# Patient Record
Sex: Male | Born: 1980 | Hispanic: No | Marital: Married | Smoking: Former smoker
Health system: Southern US, Community
[De-identification: ages and names within clinical notes are randomized; demographics above are authoritative.]

## PROBLEM LIST (undated history)

## (undated) ENCOUNTER — Emergency Department (HOSPITAL_COMMUNITY): Admission: EM | Source: Home / Self Care

## (undated) DIAGNOSIS — F419 Anxiety disorder, unspecified: Secondary | ICD-10-CM

## (undated) DIAGNOSIS — N200 Calculus of kidney: Secondary | ICD-10-CM

## (undated) DIAGNOSIS — K602 Anal fissure, unspecified: Secondary | ICD-10-CM

## (undated) DIAGNOSIS — K649 Unspecified hemorrhoids: Secondary | ICD-10-CM

## (undated) DIAGNOSIS — Z87442 Personal history of urinary calculi: Secondary | ICD-10-CM

## (undated) HISTORY — PX: NO PAST SURGERIES: SHX2092

## (undated) HISTORY — DX: Unspecified hemorrhoids: K64.9

## (undated) HISTORY — PX: POLYPECTOMY: SHX149

## (undated) HISTORY — DX: Anxiety disorder, unspecified: F41.9

## (undated) HISTORY — DX: Anal fissure, unspecified: K60.2

---
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
## 2014-10-09 ENCOUNTER — Emergency Department (HOSPITAL_COMMUNITY)
Admission: EM | Admit: 2014-10-09 | Discharge: 2014-10-09 | Disposition: A | Discharge status: Home / Self Care | Payer: No Typology Code available for payment source | Attending: Emergency Medicine | Admitting: Emergency Medicine

## 2014-10-09 ENCOUNTER — Encounter (HOSPITAL_COMMUNITY): Payer: Self-pay

## 2014-10-09 DIAGNOSIS — K2970 Gastritis, unspecified, without bleeding: Secondary | ICD-10-CM | POA: Insufficient documentation

## 2014-10-09 DIAGNOSIS — K219 Gastro-esophageal reflux disease without esophagitis: Secondary | ICD-10-CM | POA: Diagnosis not present

## 2014-10-09 DIAGNOSIS — R1013 Epigastric pain: Secondary | ICD-10-CM | POA: Diagnosis present

## 2014-10-09 LAB — CBC WITH DIFFERENTIAL/PLATELET
BASOS ABS: 0.1 10*3/uL (ref 0.0–0.1)
Basophils Relative: 1 % (ref 0–1)
EOS PCT: 3 % (ref 0–5)
Eosinophils Absolute: 0.2 10*3/uL (ref 0.0–0.7)
HEMATOCRIT: 38.9 % — AB (ref 39.0–52.0)
HEMOGLOBIN: 13.3 g/dL (ref 13.0–17.0)
Lymphocytes Relative: 39 % (ref 12–46)
Lymphs Abs: 2.9 10*3/uL (ref 0.7–4.0)
MCH: 30.6 pg (ref 26.0–34.0)
MCHC: 34.2 g/dL (ref 30.0–36.0)
MCV: 89.4 fL (ref 78.0–100.0)
MONOS PCT: 9 % (ref 3–12)
Monocytes Absolute: 0.6 10*3/uL (ref 0.1–1.0)
NEUTROS ABS: 3.6 10*3/uL (ref 1.7–7.7)
Neutrophils Relative %: 48 % (ref 43–77)
Platelets: 332 10*3/uL (ref 150–400)
RBC: 4.35 MIL/uL (ref 4.22–5.81)
RDW: 12.8 % (ref 11.5–15.5)
WBC: 7.4 10*3/uL (ref 4.0–10.5)

## 2014-10-09 LAB — COMPREHENSIVE METABOLIC PANEL
ALBUMIN: 4.1 g/dL (ref 3.5–5.0)
ALK PHOS: 49 U/L (ref 38–126)
ALT: 28 U/L (ref 17–63)
AST: 25 U/L (ref 15–41)
Anion gap: 9 (ref 5–15)
BILIRUBIN TOTAL: 0.5 mg/dL (ref 0.3–1.2)
BUN: 19 mg/dL (ref 6–20)
CO2: 25 mmol/L (ref 22–32)
CREATININE: 1.09 mg/dL (ref 0.61–1.24)
Calcium: 9.3 mg/dL (ref 8.9–10.3)
Chloride: 105 mmol/L (ref 101–111)
GFR calc Af Amer: >60 mL/min (ref >60)
GFR calc non Af Amer: >60 mL/min (ref >60)
Glucose, Bld: 94 mg/dL (ref 65–99)
Potassium: 3.6 mmol/L (ref 3.5–5.1)
Sodium: 139 mmol/L (ref 135–145)
Total Protein: 7.3 g/dL (ref 6.5–8.1)

## 2014-10-09 LAB — LIPASE, BLOOD: Lipase: 38 U/L (ref 22–51)

## 2014-10-09 MED ORDER — PANTOPRAZOLE SODIUM 40 MG PO TBEC: 40.0000 mg | DELAYED_RELEASE_TABLET | Freq: Every day | ORAL | Status: DC

## 2014-10-09 MED ORDER — ONDANSETRON 4 MG PO TBDP
4.0000 mg | ORAL_TABLET | Freq: Once | ORAL | Status: AC
  Administered 2014-10-09: 4 mg via ORAL
  Filled 2014-10-09: qty 1

## 2014-10-09 MED ORDER — GI COCKTAIL ~~LOC~~
30.0000 mL | Freq: Once | ORAL | Status: AC
  Administered 2014-10-09: 30 mL via ORAL
  Filled 2014-10-09: qty 30

## 2014-10-09 MED ORDER — ONDANSETRON 4 MG PO TBDP: 4.0000 mg | ORAL_TABLET | Freq: Three times a day (TID) | ORAL | Status: DC | PRN

## 2014-10-09 MED ORDER — SUCRALFATE 1 G PO TABS: 1.0000 g | ORAL_TABLET | Freq: Three times a day (TID) | ORAL | Status: DC

## 2014-10-09 MED ORDER — PANTOPRAZOLE SODIUM 40 MG PO TBEC
40.0000 mg | DELAYED_RELEASE_TABLET | Freq: Once | ORAL | Status: AC
  Administered 2014-10-09: 40 mg via ORAL
  Filled 2014-10-09: qty 1

## 2014-10-09 NOTE — ED Notes (Signed)
Discharge instructions reviewed, prescriptions reviewed voiced understanding

## 2014-10-09 NOTE — ED Notes (Signed)
Pt arrived via POV c/o mid upper abdominal pain with nausea and headaches for the past 10days.

## 2014-10-09 NOTE — ED Provider Notes (Signed)
TIME SEEN: 4:30 AM  CHIEF COMPLAINT: Abdominal pain, headache, nausea  HPI: Pt is a 34 y.o. male with no synovial past medical history who presents to the emergency room at 10 days of intermittent epigastric pain with burning pain that radiates up into his chest is worse after eating intermittently. Reports pain is worse with eating. No fever. He has had nausea. No vomiting or diarrhea. No bloody stools or melena. Reports he is also had intermittent mild headaches for the past several weeks which she is taking NSAIDs for. No alcohol use. No numbness, tingling or focal weakness. No head injury. Denies headache currently.  ROS: See HPI Constitutional: no fever  Eyes: no drainage  ENT: no runny nose   Cardiovascular:  no chest pain  Resp: no SOB  GI: no vomiting GU: no dysuria Integumentary: no rash  Allergy: no hives  Musculoskeletal: no leg swelling  Neurological: no slurred speech ROS otherwise negative  PAST MEDICAL HISTORY/PAST SURGICAL HISTORY:  History reviewed. No pertinent past medical history.  MEDICATIONS:  Prior to Admission medications   Not on File    ALLERGIES:  No Known Allergies  SOCIAL HISTORY:  History  Substance Use Topics  . Smoking status: Never Smoker   . Smokeless tobacco: Never Used  . Alcohol Use: No    FAMILY HISTORY: History reviewed. No pertinent family history.  EXAM: BP 143/94 mmHg  Pulse 64  Temp(Src) 98 F (36.7 C) (Oral)  Resp 16  Ht 5\' 9"  (1.753 m)  Wt 199 lb (90.266 kg)  BMI 29.37 kg/m2  SpO2 99% CONSTITUTIONAL: Alert and oriented and responds appropriately to questions. Well-appearing; well-nourished, nontoxic, afebrile HEAD: Normocephalic EYES: Conjunctivae clear, PERRL ENT: normal nose; no rhinorrhea; moist mucous membranes; pharynx without lesions noted NECK: Supple, no meningismus, no LAD  CARD: RRR; S1 and S2 appreciated; no murmurs, no clicks, no rubs, no gallops RESP: Normal chest excursion without splinting or  tachypnea; breath sounds clear and equal bilaterally; no wheezes, no rhonchi, no rales, no hypoxia or respiratory distress, speaking full sentences ABD/GI: Normal bowel sounds; non-distended; soft, non-tender, no rebound, no guarding, no peritoneal signs, negative Murphy sign, no tenderness at McBurney's point BACK:  The back appears normal and is non-tender to palpation, there is no CVA tenderness EXT: Normal ROM in all joints; non-tender to palpation; no edema; normal capillary refill; no cyanosis, no calf tenderness or swelling    SKIN: Normal color for age and race; warm NEURO: Moves all extremities equally, sensation to light touch intact diffusely, cranial nerves II through XII intact, normal gait PSYCH: The patient's mood and manner are appropriate. Grooming and personal hygiene are appropriate.  MEDICAL DECISION MAKING: Patient here with epigastric abdominal pain. Suspect gastritis, GERD. Abdominal exam is completely benign. Afebrile, hemodynamically stable. Labs unremarkable. Feels better after GI cocktail, Protonix and Zofran. We'll discharge with prescriptions for Protonix and Carafate. Have advised him to avoid NSAIDs. Discussed with patient that he may use Tylenol as needed for his headaches at home. Have recommended diet changes. I do not feel he needs further emergent workup at this time. I feel he is safe to be discharged home. Provided him with outpatient follow-up information. Discussed usual and customary return precautions. He verbalizes understanding and is comfortable with this plan.       [place], DO 10/09/14 [phone]

## 2014-10-09 NOTE — Discharge Instructions (Signed)
Please do not drink alcohol or take anti-inflammatory such as ibuprofen, aspirin, Aleve, Goody powders. You may take Tylenol 1000 mg every 6 hours as needed for pain. This medication is found over-the-counter and is safe to take for your headaches.   Food Choices for Gastroesophageal Reflux Disease When you have gastroesophageal reflux disease (GERD), the foods you eat and your eating habits are very important. Choosing the right foods can help ease the discomfort of GERD. WHAT GENERAL GUIDELINES DO I NEED TO FOLLOW?  Choose fruits, vegetables, whole grains, low-fat dairy products, and low-fat meat, fish, and poultry.  Limit fats such as oils, salad dressings, butter, nuts, and avocado.  Keep a food diary to identify foods that cause symptoms.  Avoid foods that cause reflux. These may be different for different people.  Eat frequent small meals instead of three large meals each day.  Eat your meals slowly, in a relaxed setting.  Limit fried foods.  Cook foods using methods other than frying.  Avoid drinking alcohol.  Avoid drinking large amounts of liquids with your meals.  Avoid bending over or lying down until 2-3 hours after eating. WHAT FOODS ARE NOT RECOMMENDED? The following are some foods and drinks that may worsen your symptoms: Vegetables Tomatoes. Tomato juice. Tomato and spaghetti sauce. Chili peppers. Onion and garlic. Horseradish. Fruits Oranges, grapefruit, and lemon (fruit and juice). Meats High-fat meats, fish, and poultry. This includes hot dogs, ribs, ham, sausage, salami, and bacon. Dairy Whole milk and chocolate milk. Sour cream. Cream. Butter. Ice cream. Cream cheese.  Beverages Coffee and tea, with or without caffeine. Carbonated beverages or energy drinks. Condiments Hot sauce. Barbecue sauce.  Sweets/Desserts Chocolate and cocoa. Donuts. Peppermint and spearmint. Fats and Oils High-fat foods, including [place] fries and potato  chips. Other Vinegar. Strong spices, such as black pepper, white pepper, red pepper, cayenne, curry powder, cloves, ginger, and chili powder. The items listed above may not be a complete list of foods and beverages to avoid. Contact your dietitian for more information. Document Released: 02/18/2005 Document Revised: 02/23/2013 Document Reviewed: 12/23/2012 [facility] Patient Information 2015 [place], [place]. This information is not intended to replace advice given to you by your health care provider. Make sure you discuss any questions you have with your health care provider.  Gastritis, Adult Gastritis is soreness and swelling (inflammation) of the lining of the stomach. Gastritis can develop as a sudden onset (acute) or long-term (chronic) condition. If gastritis is not treated, it can lead to stomach bleeding and ulcers. CAUSES  Gastritis occurs when the stomach lining is weak or damaged. Digestive juices from the stomach then inflame the weakened stomach lining. The stomach lining may be weak or damaged due to viral or bacterial infections. One common bacterial infection is the Helicobacter pylori infection. Gastritis can also result from excessive alcohol consumption, taking certain medicines, or having too much acid in the stomach.  SYMPTOMS  In some cases, there are no symptoms. When symptoms are present, they may include:  Pain or a burning sensation in the upper abdomen.  Nausea.  Vomiting.  An uncomfortable feeling of fullness after eating. DIAGNOSIS  Your caregiver may suspect you have gastritis based on your symptoms and a physical exam. To determine the cause of your gastritis, your caregiver may perform the following:  Blood or stool tests to check for the H pylori bacterium.  Gastroscopy. A thin, flexible tube (endoscope) is passed down the esophagus and into the stomach. The endoscope has a light and camera  on the end. Your caregiver uses the endoscope to view the inside of  the stomach.  Taking a tissue sample (biopsy) from the stomach to examine under a microscope. TREATMENT  Depending on the cause of your gastritis, medicines may be prescribed. If you have a bacterial infection, such as an H pylori infection, antibiotics may be given. If your gastritis is caused by too much acid in the stomach, H2 blockers or antacids may be given. Your caregiver may recommend that you stop taking aspirin, ibuprofen, or other nonsteroidal anti-inflammatory drugs (NSAIDs). HOME CARE INSTRUCTIONS  Only take over-the-counter or prescription medicines as directed by your caregiver.  If you were given antibiotic medicines, take them as directed. Finish them even if you start to feel better.  Drink enough fluids to keep your urine clear or pale yellow.  Avoid foods and drinks that make your symptoms worse, such as:  Caffeine or alcoholic drinks.  Chocolate.  Peppermint or mint flavorings.  Garlic and onions.  Spicy foods.  Citrus fruits, such as oranges, lemons, or limes.  Tomato-based foods such as sauce, chili, salsa, and pizza.  Fried and fatty foods.  Eat small, frequent meals instead of large meals. SEEK IMMEDIATE MEDICAL CARE IF:   You have black or dark red stools.  You vomit blood or material that looks like coffee grounds.  You are unable to keep fluids down.  Your abdominal pain gets worse.  You have a fever.  You do not feel better after 1 week.  You have any other questions or concerns. MAKE SURE YOU:  Understand these instructions.  Will watch your condition.  Will get help right away if you are not doing well or get worse. Document Released: 02/12/2001 Document Revised: 08/20/2011 Document Reviewed: 04/03/2011 [facility] Patient Information 2015 [place], [place]. This information is not intended to replace advice given to you by your health care provider. Make sure you discuss any questions you have with your health care  provider.  Gastroesophageal Reflux Disease, Adult Gastroesophageal reflux disease (GERD) happens when acid from your stomach flows up into the esophagus. When acid comes in contact with the esophagus, the acid causes soreness (inflammation) in the esophagus. Over time, GERD may create small holes (ulcers) in the lining of the esophagus. CAUSES   Increased body weight. This puts pressure on the stomach, making acid rise from the stomach into the esophagus.  Smoking. This increases acid production in the stomach.  Drinking alcohol. This causes decreased pressure in the lower esophageal sphincter (valve or ring of muscle between the esophagus and stomach), allowing acid from the stomach into the esophagus.  Late evening meals and a full stomach. This increases pressure and acid production in the stomach.  A malformed lower esophageal sphincter. Sometimes, no cause is found. SYMPTOMS   Burning pain in the lower part of the mid-chest behind the breastbone and in the mid-stomach area. This may occur twice a week or more often.  Trouble swallowing.  Sore throat.  Dry cough.  Asthma-like symptoms including chest tightness, shortness of breath, or wheezing. DIAGNOSIS  Your caregiver may be able to diagnose GERD based on your symptoms. In some cases, X-rays and other tests may be done to check for complications or to check the condition of your stomach and esophagus. TREATMENT  Your caregiver may recommend over-the-counter or prescription medicines to help decrease acid production. Ask your caregiver before starting or adding any new medicines.  HOME CARE INSTRUCTIONS   Change the factors that you can  control. Ask your caregiver for guidance concerning weight loss, quitting smoking, and alcohol consumption.  Avoid foods and drinks that make your symptoms worse, such as:  Caffeine or alcoholic drinks.  Chocolate.  Peppermint or mint flavorings.  Garlic and onions.  Spicy  foods.  Citrus fruits, such as oranges, lemons, or limes.  Tomato-based foods such as sauce, chili, salsa, and pizza.  Fried and fatty foods.  Avoid lying down for the 3 hours prior to your bedtime or prior to taking a nap.  Eat small, frequent meals instead of large meals.  Wear loose-fitting clothing. Do not wear anything tight around your waist that causes pressure on your stomach.  Raise the head of your bed 6 to 8 inches with wood blocks to help you sleep. Extra pillows will not help.  Only take over-the-counter or prescription medicines for pain, discomfort, or fever as directed by your caregiver.  Do not take aspirin, ibuprofen, or other nonsteroidal anti-inflammatory drugs (NSAIDs). SEEK IMMEDIATE MEDICAL CARE IF:   You have pain in your arms, neck, jaw, teeth, or back.  Your pain increases or changes in intensity or duration.  You develop nausea, vomiting, or sweating (diaphoresis).  You develop shortness of breath, or you faint.  Your vomit is green, yellow, black, or looks like coffee grounds or blood.  Your stool is red, bloody, or black. These symptoms could be signs of other problems, such as heart disease, gastric bleeding, or esophageal bleeding. MAKE SURE YOU:   Understand these instructions.  Will watch your condition.  Will get help right away if you are not doing well or get worse. Document Released: 11/28/2004 Document Revised: 05/13/2011 Document Reviewed: 09/07/2010 [facility] Patient Information 2015 [place], [place]. This information is not intended to replace advice given to you by your health care provider. Make sure you discuss any questions you have with your health care provider.    Emergency Department Resource Guide 1) Find a Doctor and Pay Out of Pocket Although you won't have to find out who is covered by your insurance plan, it is a good idea to ask around and get recommendations. You will then need to call the office and see if the  doctor you have chosen will accept you as a new patient and what types of options they offer for patients who are self-pay. Some doctors offer discounts or will set up payment plans for their patients who do not have insurance, but you will need to ask so you aren't surprised when you get to your appointment.  2) Contact Your Local Health Department Not all health departments have doctors that can see patients for sick visits, but many do, so it is worth a call to see if yours does. If you don't know where your local health department is, you can check in your phone book. The CDC also has a tool to help you locate your state's health department, and many state websites also have listings of all of their local health departments.  3) Find a [facility] If your illness is not likely to be very severe or complicated, you may want to try a walk in clinic. These are popping up all over the country in pharmacies, drugstores, and shopping centers. They're usually staffed by nurse practitioners or physician assistants that have been trained to treat common illnesses and complaints. They're usually fairly quick and inexpensive. However, if you have serious medical issues or chronic medical problems, these are probably not your best option.  No Primary  Care Doctor: - Call Health Connect at  [phone] - they can help you locate a primary care doctor that  accepts your insurance, provides certain services, etc. - Physician Referral Service- [phone]  Chronic Pain Problems: Organization         Address  Phone   Notes  [facility]  [phone] Patients need to be referred by their primary care doctor.   Medication Assistance: Organization         Address  Phone   Notes  [facility] Medication [facility] [address] [place], [place] [address] [phone] --Must be a resident of [facility] -- Must have NO insurance coverage whatsoever (no Medicaid/  Medicare, etc.) -- The pt. MUST have a primary care doctor that directs their care regularly and follows them in the community   [facility]ist  [phone]   [employer]  [phone]    Agencies that provide inexpensive medical care: Organization         Address  Phone   Notes  [place]  [phone]   [facility]    [phone]   [facility] [place], [place] [address] [phone]   [place] [address] [place] [phone]   Planned Parenthood    [phone]   [facility]    [phone]   [place] [address], [place] Phone:  [phone], Fax:  [phone] Hours of Operation:  9 am - 6 pm, M-F.  Also accepts Medicaid/Medicare and self-pay.  [facility] for [place], [address], [place] Phone: [phone], Fax: [phone]. Hours of Operation:  8:30 am - 5:30 pm, M-F.  Also accepts Medicaid and self-pay.  [facility] [address] [place] Phone: [phone]   [place], [place], [place] [phone], Ext. [phone] Mondays & Thursdays: 7-9 AM.  First 15 patients are seen on a first come, first serve basis.    Hope Providers:  Organization         Address  Phone   Notes  [facility] [address], [place] [phone] Also accepts self-pay patients.  [facility] [address], [place]  [phone]   [place], [address], [place] [phone]   [facility] [address] [place] [phone]   Lucianne Lei [address], [place]   [phone] Only accepts [place] Access [place] patients after they have their name applied to their card.    Self-Pay (no insurance) in [facility]:  Organization         Address  Phone   Notes  Sickle Cell Patients, [facility] [place] [phone]   [facility] [place] [phone]   [facility] [place]  [place], [place], [place] [phone]   [facility]/Dr. Osei-Bonsu  [address], [place] or [address], [place] [phone] Phone number for both [place] and [place] locations is the same.  Urgent Medical and [facility] [address] [place] [phone]  [facility] [address], [place] or [address] [phone]   [facility] [place], [place] [phone], phone; [phone], fax Sees patients 1st and 3rd Saturday of every month.  Must not qualify for public or private insurance (i.e. Medicaid, Medicare, [place] Health Choice, Veterans' Benefits)  Household income should be no more than 200% of the poverty level The clinic cannot treat you if you are pregnant or think you are pregnant  Sexually transmitted diseases are not treated at the clinic.    Dental Care: Organization         Address  Phone  Notes  [facility] Department of [facility] [place] [phone] Accepts children up to age 20 who are enrolled in [place] or [place]; pregnant women with a Medicaid card; and children who have applied for Medicaid or [place] Health Choice, but were declined, whose parents can pay a reduced fee at time of service.  [facility] Department of [facility]  [address], [place] [phone] Accepts children up to age 106 who are enrolled in [place] or [place]; pregnant women with a Medicaid card; and children who have applied for Medicaid or [place] Health Choice,  but were declined, whose parents can pay a reduced fee at time of service.  [facility] Adult [facility] Access PRO[facility]  [place] [phone] Patients are seen by appointment only. Walk-ins are not accepted. [place] will see patients 51 years of age and older. Monday - Tuesday (8am-5pm) Most Wednesdays (8:30-5pm) $30 per visit, cash only  [facility] Adult Dental Access PROGRAM  [address], [facility] [phone] Patients are seen by appointment only. Walk-ins are not accepted. [place] will see patients 48 years of age and older. One Wednesday Evening (Monthly: Volunteer Based).  $30 per visit, cash only  [place]  [phone] for adults; Children under age 50, call Graduate Pediatric Dentistry at [phone]. Children aged 56-14, please call [phone] to request a pediatric application.  Dental services are provided in all areas of dental care including fillings, crowns and bridges, complete and partial dentures, implants, gum treatment, root canals, and extractions. Preventive care is also provided. Treatment is provided to both adults and children. Patients are selected via a lottery and there is often a waiting list.   [facility] [address], [place]  [phone] [email or web address]   [facility] [address] [place], [place] [phone], Ext. [phone] Second and Fourth Thursday of each month, opens at 6:30 AM; Clinic ends at 9 AM.  Patients are seen on a first-come first-served basis, and a limited number are seen during each clinic.   [facility]  [address] [place], [place] [phone]   Eligibility Requirements You must have lived in [place], [place], or [place] counties for at least the last three months.   You cannot be eligible for state or federal sponsored [employer], including [employer], [place], or [employer].   You generally  cannot be eligible for healthcare insurance through your employer.    How to apply: Eligibility screenings are held every Tuesday and Wednesday afternoon from 1:00 pm until 4:00 pm. You do not need an appointment for the interview!  [facility] [address], [place], [place]  [place]  [place] Department  [place]  [phone]    Behavioral Health Resources in the Community: Intensive Outpatient Programs Organization         Address  Phone  Notes  [place]. [address] [place], [place] [phone]   [facility] [address] [place], [place]   ADS: Alcohol & Drug Svcs [address], [place], [place]   [place] [address],  [place], [place] or [phone]   Substance Abuse Resources Organization         Address  Phone  Notes  Alcohol and Drug Services  [phone]   [place]  [phone]   [facility] [place]   Chinita Pester  [phone]   Residential & Outpatient Substance Abuse Program  [phone]   Psychological Services Organization         Address  Phone  Notes  [facility] [place]  [place]  [phone]   [place] [address] [place] or [phone]    Mobile Crisis Teams Organization         Address  Phone  Notes  [facility], Mobile Crisis Care Unit  [phone]   [facility]  [address] [place], [place]   [place] [address], [place] [phone]    Self-Help/Support Groups Organization         Address  Phone             Notes  [place]. of [place] - variety of support groups  [place] Call for more  information  Narcotics Anonymous (NA), [facility] [address], [employer] [place]  2 meetings at this location   Special educational needs teacher         Address  Phone  Notes  [facility]idential [facility] [place],    [place]  [phone]   [facility]  [address] [place] [id number], [place], [place]   [place], [place] [phone] Admissions: 8am-3pm M-F  Incentives Substance [place] [address],    [place], [place] [phone]   [facility] [address] [place], [place], [place]   [facility] [address]  [place], [place]   [facility] - Intensive Outpatient [address], [place], [place]   [facility] [place].,  [place], [place] [phone] or [phone]   [facility] Services (RTS) [address] [place], [place] Accepts Medicaid  [facility] [place] [address],  [place] [phone] Substance Abuse/Addiction Treatment   [facility] Organization         Address  Phone  Notes  [facility]  [phone]   Domenic Schwab, PhD [address] [place], [place]   5876825902 or [phone]   [place], [place] [phone]   [place] [address] [place], [place] [phone] Insurance/Medicaid/sponsorship through [employer] and Families [address], [id number]  [place], [place] [phone] [place], [place] [phone]    Dr. Adele Schilder  [phone]   [facility] Dept. 1) [address] [place] 2) [place] 3)  [place] [address], [place] [phone]  [phone]   [place] [phone] or [phone] (After Hours)

## 2015-09-06 ENCOUNTER — Encounter: Payer: Self-pay | Admitting: Surgery

## 2016-08-31 ENCOUNTER — Encounter (HOSPITAL_COMMUNITY): Payer: Self-pay | Admitting: Emergency Medicine

## 2016-08-31 ENCOUNTER — Emergency Department (HOSPITAL_COMMUNITY)
Admission: EM | Admit: 2016-08-31 | Discharge: 2016-08-31 | Disposition: A | Discharge status: Home / Self Care | Payer: PRIVATE HEALTH INSURANCE | Attending: Emergency Medicine | Admitting: Emergency Medicine

## 2016-08-31 DIAGNOSIS — M545 Low back pain: Secondary | ICD-10-CM | POA: Diagnosis present

## 2016-08-31 DIAGNOSIS — Z79899 Other long term (current) drug therapy: Secondary | ICD-10-CM | POA: Diagnosis not present

## 2016-08-31 DIAGNOSIS — G8929 Other chronic pain: Secondary | ICD-10-CM | POA: Diagnosis not present

## 2016-08-31 DIAGNOSIS — M5442 Lumbago with sciatica, left side: Secondary | ICD-10-CM | POA: Diagnosis not present

## 2016-08-31 MED ORDER — OXYCODONE HCL 5 MG PO TABS: 5.0000 mg | ORAL_TABLET | Freq: Four times a day (QID) | ORAL | 0 refills | Status: AC | PRN

## 2016-08-31 MED ORDER — LIDOCAINE 5 % EX PTCH
1.0000 | MEDICATED_PATCH | CUTANEOUS | Status: DC
  Administered 2016-08-31: 1 via TRANSDERMAL
  Filled 2016-08-31: qty 1

## 2016-08-31 MED ORDER — ACETAMINOPHEN 500 MG PO TABS
1000.0000 mg | ORAL_TABLET | Freq: Once | ORAL | Status: AC
  Administered 2016-08-31: 1000 mg via ORAL
  Filled 2016-08-31: qty 2

## 2016-08-31 MED ORDER — PREDNISONE 10 MG (21) PO TBPK: ORAL_TABLET | Freq: Every day | ORAL | 0 refills | Status: DC

## 2016-08-31 MED ORDER — KETOROLAC TROMETHAMINE 30 MG/ML IJ SOLN
30.0000 mg | Freq: Once | INTRAMUSCULAR | Status: AC
  Administered 2016-08-31: 30 mg via INTRAMUSCULAR
  Filled 2016-08-31: qty 1

## 2016-08-31 MED ORDER — LIDOCAINE 5 % EX PTCH: 1.0000 | MEDICATED_PATCH | CUTANEOUS | 0 refills | Status: AC

## 2016-08-31 MED ORDER — OXYCODONE HCL 5 MG PO TABS
5.0000 mg | ORAL_TABLET | Freq: Once | ORAL | Status: AC
  Administered 2016-08-31: 5 mg via ORAL
  Filled 2016-08-31: qty 1

## 2016-08-31 MED ORDER — PREDNISONE 20 MG PO TABS
60.0000 mg | ORAL_TABLET | Freq: Once | ORAL | Status: AC
  Administered 2016-08-31: 60 mg via ORAL
  Filled 2016-08-31: qty 3

## 2016-08-31 NOTE — ED Provider Notes (Signed)
[facility] DEPT Provider Note   CSN: [id number] Arrival date & time: 08/31/16  1623     History   Chief Complaint Chief Complaint  Patient presents with  . Back Pain    HPI Jeremy Mendoza is a 36 y.o. male with history of GERD presents to the ED with persistent lower back pain, bilateral, 2 months. Pain is intermittent, radiating to lateral left lower leg. Associated symptoms include intermittent numbness to the entire left foot and intermittent left-sided scrotal numbness. Denies fevers, abdominal pain, IV drug use, bladder/bowel incontinence or retention. No preceding falls, and MVCs, exertional activities or previous injuries. Patient was evaluated at urgent care and was prescribed prednisone, Flexeril, diclofenac. Patient reports mild improvement with prednisone.  HPI  History reviewed. No pertinent past medical history.  There are no active problems to display for this patient.   History reviewed. No pertinent surgical history.     Home Medications    Prior to Admission medications   Medication Sig Start Date End Date Taking? Authorizing Provider  lidocaine (LIDODERM) 5 % Place 1 patch onto the skin daily. Apply to low back. Change every 24 hours. 08/31/16 09/05/16  Kinnie Feil, PA-C  ondansetron (ZOFRAN ODT) 4 MG disintegrating tablet Take 1 tablet (4 mg total) by mouth every 8 (eight) hours as needed for nausea or vomiting. 10/09/14   Ward, Delice Bison, DO  oxyCODONE (ROXICODONE) 5 MG immediate release tablet Take 1 tablet (5 mg total) by mouth every 6 (six) hours as needed for severe pain. 08/31/16 09/03/16  Kinnie Feil, PA-C  pantoprazole (PROTONIX) 40 MG tablet Take 1 tablet (40 mg total) by mouth daily. 10/09/14   Ward, Delice Bison, DO  predniSONE (STERAPRED UNI-PAK 21 TAB) 10 MG (21) TBPK tablet Take by mouth daily. Take 6 tabs by mouth daily  for 2 days, then 5 tabs for 2 days, then 4 tabs for 2 days, then 3 tabs for 2 days, 2 tabs for 2 days, then 1 tab by  mouth daily for 2 days 08/31/16   Kinnie Feil, PA-C  sucralfate (CARAFATE) 1 G tablet Take 1 tablet (1 g total) by mouth 4 (four) times daily -  with meals and at bedtime. 10/09/14   Ward, Delice Bison, DO    Family History No family history on file.  Social History Social History  Substance Use Topics  . Smoking status: Never Smoker  . Smokeless tobacco: Never Used  . Alcohol use No     Allergies   Patient has no known allergies.   Review of Systems Review of Systems  Constitutional: Negative for fever and unexpected weight change.  Gastrointestinal: Negative for abdominal pain, constipation, diarrhea, nausea and vomiting.  Genitourinary: Negative for difficulty urinating.       +scrotal numbness   Musculoskeletal: Positive for back pain and myalgias. Negative for neck pain and neck stiffness.  Skin: Negative for color change and rash.  Allergic/Immunologic: Negative for immunocompromised state.  Neurological: Positive for numbness. Negative for weakness and headaches.     Physical Exam Updated Vital Signs BP 135/79 (BP Location: Left Arm)   Pulse 71   Temp 98.1 F (36.7 C) (Oral)   Resp 16   Ht 5\' 9"  (1.753 m)   Wt 89.8 kg (198 lb)   SpO2 100%   BMI 29.24 kg/m   Physical Exam  Constitutional: He is oriented to person, place, and time. He appears well-developed and well-nourished. No distress.  NAD.  HENT:  Head: Normocephalic  and atraumatic.  Right Ear: External ear normal.  Left Ear: External ear normal.  Nose: Nose normal.  Eyes: Conjunctivae and EOM are normal. No scleral icterus.  Neck: Normal range of motion. Neck supple.  No midline cervical spine tenderness  Cardiovascular: Normal rate, regular rhythm, normal heart sounds and intact distal pulses.   No murmur heard. Pulmonary/Chest: Effort normal and breath sounds normal. He has no wheezes.  Abdominal: Soft. There is no tenderness.  Genitourinary:  Genitourinary Comments: Sensation to light  touch (q tip) intact over scrotum Cremasteric reflex intact bilaterally  Musculoskeletal: Normal range of motion. He exhibits tenderness. He exhibits no deformity.  Antalgic gait favoring L side.  Pain with lumbar flexion/extension otherwise full active ROM of CTL spine including flexion, extension, lateral bend and rotation Midline lumbar spine tenderness, bilateral SI joints, bilateral sciatic notches (L>R).  No CTL paraspinal tenderness or increased tone.  Full passive hip, knee and ankle ROM bilaterally.  Positive SLR, bilaterally.  Positive Faber, bilaterally.  Negative Stinchfield test.   Neurological: He is alert and oriented to person, place, and time.  5/5 strength with hip flexion and extension, bilaterally.  5/5 strength with knee flexion and extension, bilaterally.  5/5 strength with ankle dorsiflexion and plantar flexion, bilaterally.  Sensation to light touch intact in the distribution of the obturator nerve and lateral cutaneous nerve  Feet: sensation to light touch intact in the distribution of the saphenous nerve and sural nerve, bilaterally.  2/4 knee DTR bilaterally.    Skin: Skin is warm and dry. Capillary refill takes less than 2 seconds.  Psychiatric: He has a normal mood and affect. His behavior is normal. Judgment and thought content normal.  Nursing note and vitals reviewed.    ED Treatments / Results  Labs (all labs ordered are listed, but only abnormal results are displayed) Labs Reviewed - No data to display  EKG  EKG Interpretation None       Radiology No results found.  Procedures Procedures (including critical care time)  Medications Ordered in ED Medications  lidocaine (LIDODERM) 5 % 1 patch (not administered)  ketorolac (TORADOL) 30 MG/ML injection 30 mg (30 mg Intramuscular Given 08/31/16 1743)  acetaminophen (TYLENOL) tablet 1,000 mg (1,000 mg Oral Given 08/31/16 1742)  oxyCODONE (Oxy IR/ROXICODONE) immediate release tablet 5 mg (5 mg  Oral Given 08/31/16 1742)  predniSONE (DELTASONE) tablet 60 mg (60 mg Oral Given 08/31/16 1742)     Initial Impression / Assessment and Plan / ED Course  I have reviewed the triage vital signs and the nursing notes.  Pertinent labs & imaging results that were available during my care of the patient were reviewed by me and considered in my medical decision making (see chart for details).    Patient is a 36 y.o. male with no pertinent pmh presents to the ED with bilateral LBP that radiates to left lower leg associated with intermittent numbness to the left foot and left scrotum 2 months. On exam TTP to midline lumbar spine, bilateral SI joints, bilateral sciatic notches. +SLR bilaterally. Normal LE neurological exam. Light touch to scrotum intact today with cremasteric reflex also intact. Initial differential diagnoses include lumbar radiculopathy (most likely), spinal spasms and less likely compression fracture given no trauma. Doubt nephrolithiasis, epidural abscess, dissection, AAA. He does not have fecal incontinence, urinary retention or overflow incontinence, night sweats, waking from sleep with back pain, unexplained fevers or weight loss, h/o cancer, IVDU, recent trauma. However he reports intermittent left scrotal numbness and  left foot numbness so I am considering cauda equina, however neurological exam is normal and scrotal numbness is only intermittent, and symptoms have been going on for >2 months.   Final Clinical Impressions(s) / ED Diagnoses   Final diagnoses:  Chronic bilateral low back pain with left-sided sciatica   I discussed need for MRI. We discussed risk vs benefit of ED MRI to rule out cauda equina.  At this time patient deferred imaging and would like to do this as outpatient due to cost and time. Will discharge with prednisone, high dose NSAIDs, muscle relaxers, lidocaine patch, oxy for break through pain and NSGY follow up. Patient is aware that MRI needs to be done ASAP.   He is aware of symptoms that would warrant immediate return to ED.   New Prescriptions New Prescriptions   LIDOCAINE (LIDODERM) 5 %    Place 1 patch onto the skin daily. Apply to low back. Change every 24 hours.   OXYCODONE (ROXICODONE) 5 MG IMMEDIATE RELEASE TABLET    Take 1 tablet (5 mg total) by mouth every 6 (six) hours as needed for severe pain.   PREDNISONE (STERAPRED UNI-PAK 21 TAB) 10 MG (21) TBPK TABLET    Take by mouth daily. Take 6 tabs by mouth daily  for 2 days, then 5 tabs for 2 days, then 4 tabs for 2 days, then 3 tabs for 2 days, 2 tabs for 2 days, then 1 tab by mouth daily for 2 days     Kinnie Feil, PA-C 08/31/16 1748    Kinnie Feil, PA-C 08/31/16 [place], [place], DO 08/31/16 2055

## 2016-08-31 NOTE — Discharge Instructions (Signed)
As we discussed I suspect your back pain is likely from inflammation of your sciatic nerves.  You have not fallen or injured your back, so it is unlikely you have a fracture.  We discussed MRI today, given your intermittent testicular numbness, however you opted to do this as an outpatient to minimize costs. I think this is reasonable, as your symptoms have been chronic (2 months) and your physical exam today was normal.    You need to get an MRI as soon as possible to rule out cauda equina or severe cord compression.  Establish care with a primary care provider, who may be able to order an MRI as outpatient. Alternatively, you can go directly to a neurosurgeon for further evaluation, a neurosurgeon can also order an MRI.   For symptoms: Prednisone as prescribed 600 mg ibuprofen + 1000 mg every 8 hours for mild/moderate pain 5 mg oxycodone for break through pain or severe pain only  Oxycodone is a narcotic pain medication that has risk of overdose and death, dependence and abuse. Do not drive or use heavy machinery. Do not leave unattended around children.

## 2016-08-31 NOTE — ED Triage Notes (Signed)
Pt c/o low back pain that radiates down B/L legs ongoing for past couple of months. Pt has been seen by PMD for same.

## 2016-09-05 ENCOUNTER — Encounter: Payer: Self-pay | Admitting: Physical Therapy

## 2016-09-05 ENCOUNTER — Ambulatory Visit: Payer: No Typology Code available for payment source | Attending: Family Medicine | Admitting: Physical Therapy

## 2016-09-05 DIAGNOSIS — M5442 Lumbago with sciatica, left side: Secondary | ICD-10-CM | POA: Insufficient documentation

## 2016-09-05 DIAGNOSIS — G8929 Other chronic pain: Secondary | ICD-10-CM | POA: Diagnosis present

## 2016-09-05 DIAGNOSIS — M62838 Other muscle spasm: Secondary | ICD-10-CM | POA: Insufficient documentation

## 2016-09-05 NOTE — Therapy (Addendum)
[facility] [place], [place], [address] Phone: [phone]   Fax:  [phone]  Physical Therapy Evaluation/ Discharge  Patient Details  Name: Jeremy Mendoza MRN: [id number] Date of Birth: 1980/07/11 Referring Provider: Dr Marlane Mingle   Encounter Date: 09/05/2016      PT End of Session - 09/05/16 1341    Visit Number 1   Number of Visits 16   Date for PT Re-Evaluation 10/31/16   Authorization Type [employer]    PT Start Time 1015   PT Stop Time 1101   PT Time Calculation (min) 46 min   Activity Tolerance Patient tolerated treatment well   Behavior During Therapy [facility] for tasks assessed/performed      History reviewed. No pertinent past medical history.  History reviewed. No pertinent surgical history.  There were no vitals filed for this visit.       Subjective Assessment - 09/05/16 1024    Subjective Patient reports about 2 months ago he began having left sided lower back pain and left radicular pain into his anterior thigh. He has had lower backpain before but it has always improved. This pain has not improved. He has increased pain when he sits for too long. 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   Limitations Sitting;Standing;Walking   How long can you sit comfortably? < 10 minutes. Then feels like he can not stand    How long can you stand comfortably? < 10 minutes    How long can you walk comfortably? Limited community distances    Diagnostic tests Nothing at this time    Patient Stated Goals To return to work and to the gym    Currently in Pain? Yes   Pain Score 9    Pain Location Back   Pain Orientation Left   Pain Type Acute pain   Pain Onset More than a month ago   Pain Frequency Constant   Aggravating Factors  Sitting, Standing and walking    Pain Relieving Factors muscle relaxer,    Effect of Pain on Daily Activities Has difficulty standing and  sitting             [facility] PT Assessment - 09/05/16 0001      Assessment   Medical Diagnosis Low back pain    Referring Provider Dr Marlane Mingle    Onset Date/Surgical Date --  2 months    Hand Dominance Right   Next MD Visit None scheduled    Prior Therapy None      Precautions   Precautions None     Restrictions   Weight Bearing Restrictions No     Balance Screen   Has the patient fallen in the past 6 months No   Has the patient had a decrease in activity level because of a fear of falling?  No   Is the patient reluctant to leave their home because of a fear of falling?  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    Leisure [employer]      Cognition   Overall Cognitive Status Within Functional Limits for tasks assessed   Attention Focused   Focused Attention Appears intact   Memory Appears intact   Awareness Appears intact   Problem Solving Appears intact  Sensation   Additional Comments Numbness into the front of his leg      Coordination   Gross Motor Movements are Fluid and Coordinated Yes   Fine Motor Movements are Fluid and Coordinated Yes     ROM / Strength   AROM / PROM / Strength AROM;PROM;Strength     AROM   AROM Assessment Site Lumbar   Lumbar Flexion inclreased pain limited 50%    Lumbar Extension Normal without pain    Lumbar - Right Side Bend ERP    Lumbar - Left Side Bend limited 50% with pain    Lumbar - Right Rotation ERP on the left    Lumbar - Left Rotation limited 25% with pain at end range     PROM   PROM Assessment Site Hip   Right/Left Hip Right;Left   Left Hip Flexion 90 degrees   Left Hip Internal Rotation  --  10 degrees with pain      Strength   Strength Assessment Site Hip;Knee   Right/Left Hip Left   Left Hip Flexion 5/5   Left Hip ABduction 5/5   Left Hip ADduction 5/5     Palpation    Palpation comment spasming into the left glut medius and left lumbar spine      Special Tests    Special Tests --  Straight leg raise (+) at 30 degrees      Ambulation/Gait   Gait Comments decreased left hip flexion with gait             Objective measurements completed on examination: See above findings.          [facility] Adult PT Treatment/Exercise - 09/05/16 0001      Exercises   Exercises --     Lumbar Exercises: Stretches   Single Knee to Chest Stretch Limitations 2x30sec hold bilateral    Piriformis Stretch Limitations 2x30 sec hold      Lumbar Exercises: Standing   Other Standing Lumbar Exercises tennis ball release to glut medius      Lumbar Exercises: Prone   Other Prone Lumbar Exercises prone on elbows 3 min; prone press-ups x10 improvement in prone                 PT Education - 09/05/16 1340    Education provided Yes   Education Details HEP, Continue with stretching and strengthening    Person(s) Educated Patient   Methods Explanation;Demonstration;Tactile cues;Verbal cues   Comprehension Verbalized understanding;Returned demonstration;Verbal cues required          PT Short Term Goals - 09/05/16 1359      PT SHORT TERM GOAL #1   Title Patient will demsotrate a good core contraction with proper breathing    Time 4   Period Weeks   Status New     PT SHORT TERM GOAL #2   Title Patient will increase active left hip flexion to 110 degrees    Time 4   Period Weeks   Status New     PT SHORT TERM GOAL #3   Title Patient will be independent with initial HEP    Time 4   Period Weeks   Status New     PT SHORT TERM GOAL #4   Title Patient will report centralized lower back pain < 3/10    Time 4   Period Weeks   Status New           PT Long Term Goals - 09/05/16 1401  PT LONG TERM GOAL #1   Title Patient will demsotrate full active and passive left hip flexion in order to sit in his car at work    Time 8   Period Weeks    Status New     PT LONG TERM GOAL #2   Title Patient will return to the gym with a program to promote core strength and to decrease stress on the patients back.    Time 8   Period Weeks   Status New     PT LONG TERM GOAL #3   Title Patient will demostrate a 46% limitation on FOTO    Time 8   Period Weeks   Status New                Plan - 09/05/16 1342    Clinical Impression Statement Patient is a 36 year old male with left sided radicular symptoms and ledt sided lower back pain. The pain follows a femoral nerve patrern. He has increased pain with flexionand a positive strength leg test. He has increased pain when sitting. Signs and symotms are consitent with a lumbar disc buldge. He would benefit from further therapy to decrease pain and improve core strength he was seen for a low complexity evaulustion.    Clinical Presentation Evolving   Clinical Presentation due to: radiuclar pain into his left leg that is increasing    Clinical Decision Making Low   Rehab Potential Good   PT Frequency 2x / week   PT Duration 8 weeks   PT Treatment/Interventions ADLs/Self Care Home Management;Cryotherapy;Electrical Stimulation;Iontophoresis '4mg'$ /ml Dexamethasone;Moist Heat;Ultrasound;Parrafin;Gait training;Stair training;Patient/family education;Neuromuscular re-education;Therapeutic exercise;Therapeutic activities;Manual techniques;Passive range of motion;Splinting;Taping   PT Next Visit Plan assess tolerance to prone activity; Continue with soft tissue mobilization of the lumbar spine. Consdier dry needling of glut medius; conadier manual traction/ LAD of the left hip; add abdominal set; clamshell with abdominal set; Consdier qurdurped exercises; review imuse of lumbar roll.    PT Home Exercise Plan prone on elbow, prone press up, piriformis stretch to stretch glut, single knee to chest to improve knee flexion;    Consulted and Agree with Plan of Care Patient      Patient will benefit from  skilled therapeutic intervention in order to improve the following deficits and impairments:  Impaired sensation, Pain, Increased muscle spasms  Visit Diagnosis: Chronic left-sided low back pain with left-sided sciatica - Plan: PT plan of care cert/re-cert  Other muscle spasm - Plan: PT plan of care cert/re-cert   PHYSICAL THERAPY DISCHARGE SUMMARY  Visits from Start of Care: 1  Current functional level related to goals / functional outcomes: Initial Eval only    Remaining deficits: IE only    Education / Equipment: IE only  Plan: Patient agrees to discharge.  Patient goals were not met. Patient is being discharged due to not returning since the last visit.  ?????       Problem List There are no active problems to display for this patient.   Carney Living PT DPT  09/05/2016, 2:13 PM  [facility] [address] [place], [place], [address] Phone: [phone]   Fax:  [phone]  Name: Keagon Glascoe MRN: [id number] Date of Birth: Mar 25, 1980

## 2016-09-25 ENCOUNTER — Encounter: Payer: No Typology Code available for payment source | Admitting: Physical Therapy

## 2016-10-02 ENCOUNTER — Encounter: Payer: No Typology Code available for payment source | Admitting: Physical Therapy

## 2016-10-08 ENCOUNTER — Encounter: Payer: No Typology Code available for payment source | Admitting: Physical Therapy

## 2016-10-15 ENCOUNTER — Encounter: Payer: No Typology Code available for payment source | Admitting: Physical Therapy

## 2016-10-22 ENCOUNTER — Encounter: Payer: No Typology Code available for payment source | Admitting: Physical Therapy

## 2017-05-30 ENCOUNTER — Ambulatory Visit: Payer: Self-pay | Admitting: Internal Medicine

## 2017-09-10 ENCOUNTER — Emergency Department (HOSPITAL_COMMUNITY)
Admission: EM | Admit: 2017-09-10 | Discharge: 2017-09-11 | Disposition: A | Discharge status: Home / Self Care | Payer: No Typology Code available for payment source | Attending: Emergency Medicine | Admitting: Emergency Medicine

## 2017-09-10 ENCOUNTER — Other Ambulatory Visit: Payer: Self-pay

## 2017-09-10 ENCOUNTER — Encounter (HOSPITAL_COMMUNITY): Payer: Self-pay

## 2017-09-10 DIAGNOSIS — R1084 Generalized abdominal pain: Secondary | ICD-10-CM | POA: Insufficient documentation

## 2017-09-10 DIAGNOSIS — N132 Hydronephrosis with renal and ureteral calculous obstruction: Secondary | ICD-10-CM | POA: Diagnosis not present

## 2017-09-10 DIAGNOSIS — N201 Calculus of ureter: Secondary | ICD-10-CM | POA: Diagnosis not present

## 2017-09-10 DIAGNOSIS — N23 Unspecified renal colic: Secondary | ICD-10-CM

## 2017-09-10 LAB — URINALYSIS, ROUTINE W REFLEX MICROSCOPIC
BILIRUBIN URINE: NEGATIVE
Bacteria, UA: NONE SEEN
Glucose, UA: NEGATIVE mg/dL
KETONES UR: NEGATIVE mg/dL
Leukocytes, UA: NEGATIVE
NITRITE: NEGATIVE
PH: 5 (ref 5.0–8.0)
Protein, ur: 30 mg/dL — AB
Specific Gravity, Urine: 1.027 (ref 1.005–1.030)

## 2017-09-10 MED ORDER — ONDANSETRON 4 MG PO TBDP
4.0000 mg | ORAL_TABLET | Freq: Once | ORAL | Status: AC
  Administered 2017-09-10: 4 mg via ORAL
  Filled 2017-09-10: qty 1

## 2017-09-10 MED ORDER — OXYCODONE-ACETAMINOPHEN 5-325 MG PO TABS
1.0000 | ORAL_TABLET | ORAL | Status: DC | PRN
  Administered 2017-09-10: 1 via ORAL
  Filled 2017-09-10: qty 1

## 2017-09-10 NOTE — ED Triage Notes (Signed)
Pt states that for the past several days he has been having L sided flank pain, with dysuria, hesitation, denies hematuria, c/o of nausea.

## 2017-09-11 ENCOUNTER — Emergency Department (HOSPITAL_COMMUNITY): Payer: No Typology Code available for payment source

## 2017-09-11 LAB — BASIC METABOLIC PANEL
Anion gap: 7 (ref 5–15)
BUN: 18 mg/dL (ref 6–20)
CALCIUM: 9.1 mg/dL (ref 8.9–10.3)
CHLORIDE: 105 mmol/L (ref 98–111)
CO2: 26 mmol/L (ref 22–32)
Creatinine, Ser: 1.12 mg/dL (ref 0.61–1.24)
GFR calc non Af Amer: >60 mL/min (ref >60)
Glucose, Bld: 109 mg/dL — ABNORMAL HIGH (ref 70–99)
Potassium: 3.9 mmol/L (ref 3.5–5.1)
SODIUM: 138 mmol/L (ref 135–145)

## 2017-09-11 LAB — CBC WITH DIFFERENTIAL/PLATELET
Abs Immature Granulocytes: 0.1 10*3/uL (ref 0.0–0.1)
Basophils Absolute: 0.1 10*3/uL (ref 0.0–0.1)
Basophils Relative: 1 %
Eosinophils Absolute: 0.2 10*3/uL (ref 0.0–0.7)
Eosinophils Relative: 2 %
HCT: 43.2 % (ref 39.0–52.0)
HEMOGLOBIN: 14.2 g/dL (ref 13.0–17.0)
Immature Granulocytes: 1 %
LYMPHS ABS: 2 10*3/uL (ref 0.7–4.0)
LYMPHS PCT: 21 %
MCH: 30.1 pg (ref 26.0–34.0)
MCHC: 32.9 g/dL (ref 30.0–36.0)
MCV: 91.7 fL (ref 78.0–100.0)
Monocytes Absolute: 0.8 10*3/uL (ref 0.1–1.0)
Monocytes Relative: 9 %
Neutro Abs: 6.4 10*3/uL (ref 1.7–7.7)
Neutrophils Relative %: 66 %
PLATELETS: 308 10*3/uL (ref 150–400)
RBC: 4.71 MIL/uL (ref 4.22–5.81)
RDW: 12.1 % (ref 11.5–15.5)
WBC: 9.5 10*3/uL (ref 4.0–10.5)

## 2017-09-11 MED ORDER — TAMSULOSIN HCL 0.4 MG PO CAPS: 0.4000 mg | ORAL_CAPSULE | Freq: Every day | ORAL | 0 refills | Status: DC

## 2017-09-11 MED ORDER — KETOROLAC TROMETHAMINE 30 MG/ML IJ SOLN
30.0000 mg | Freq: Once | INTRAMUSCULAR | Status: DC
  Filled 2017-09-11: qty 1

## 2017-09-11 MED ORDER — HYDROMORPHONE HCL 1 MG/ML IJ SOLN
1.0000 mg | Freq: Once | INTRAMUSCULAR | Status: AC
  Administered 2017-09-11: 1 mg via INTRAVENOUS
  Filled 2017-09-11: qty 1

## 2017-09-11 MED ORDER — OXYCODONE-ACETAMINOPHEN 5-325 MG PO TABS: 1.0000 | ORAL_TABLET | ORAL | 0 refills | Status: DC | PRN

## 2017-09-11 NOTE — ED Notes (Signed)
Patient verbalizes understanding of discharge instructions. Opportunity for questioning and answers were provided. Armband removed by staff, pt discharged from ED. E signature not available.  

## 2017-09-11 NOTE — ED Notes (Signed)
ED Provider at bedside. 

## 2017-09-11 NOTE — ED Provider Notes (Signed)
[facility] EMERGENCY DEPARTMENT Provider Note   CSN: [id number] Arrival date & time: 09/10/17  2037     History   Chief Complaint Chief Complaint  Patient presents with  . Flank Pain    HPI Jeremy Mendoza is a 37 y.o. male.  Patient presents to the emergency department for evaluation of flank pain.  Patient reports for the last 3 or 4 days he has been having intermittent pain in the left lower back area.  Tonight, however, the pain became severe.  He reports sharp and stabbing pain that had him bent over and unable to walk.  He has felt like he has to urinate frequency but there is very little urine.  He has not had any fever.  He has never had similar pain.     History reviewed. No pertinent past medical history.  There are no active problems to display for this patient.   History reviewed. No pertinent surgical history.      Home Medications    Prior to Admission medications   Medication Sig Start Date End Date Taking? Authorizing Provider  ondansetron (ZOFRAN ODT) 4 MG disintegrating tablet Take 1 tablet (4 mg total) by mouth every 8 (eight) hours as needed for nausea or vomiting. 10/09/14   Ward, Delice Bison, DO  pantoprazole (PROTONIX) 40 MG tablet Take 1 tablet (40 mg total) by mouth daily. 10/09/14   Ward, Delice Bison, DO  predniSONE (STERAPRED UNI-PAK 21 TAB) 10 MG (21) TBPK tablet Take by mouth daily. Take 6 tabs by mouth daily  for 2 days, then 5 tabs for 2 days, then 4 tabs for 2 days, then 3 tabs for 2 days, 2 tabs for 2 days, then 1 tab by mouth daily for 2 days 08/31/16   Kinnie Feil, PA-C  sucralfate (CARAFATE) 1 G tablet Take 1 tablet (1 g total) by mouth 4 (four) times daily -  with meals and at bedtime. 10/09/14   Ward, Delice Bison, DO    Family History No family history on file.  Social History Social History   Tobacco Use  . Smoking status: Never Smoker  . Smokeless tobacco: Never Used  Substance Use Topics  . Alcohol use: No  .  Drug use: No     Allergies   Patient has no known allergies.   Review of Systems Review of Systems  Genitourinary: Positive for flank pain and frequency.  All other systems reviewed and are negative.    Physical Exam Updated Vital Signs BP (!) 170/103 (BP Location: Left Arm)   Pulse 71   Temp 98.5 F (36.9 C) (Oral)   Resp 20   SpO2 100%   Physical Exam  Constitutional: He is oriented to person, place, and time. He appears well-developed and well-nourished. No distress.  HENT:  Head: Normocephalic and atraumatic.  Right Ear: Hearing normal.  Left Ear: Hearing normal.  Nose: Nose normal.  Mouth/Throat: Oropharynx is clear and moist and mucous membranes are normal.  Eyes: Pupils are equal, round, and reactive to light. Conjunctivae and EOM are normal.  Neck: Normal range of motion. Neck supple.  Cardiovascular: Regular rhythm, S1 normal and S2 normal. Exam reveals no gallop and no friction rub.  No murmur heard. Pulmonary/Chest: Effort normal and breath sounds normal. No respiratory distress. He exhibits no tenderness.  Abdominal: Soft. Normal appearance and bowel sounds are normal. There is no hepatosplenomegaly. There is no tenderness. There is CVA tenderness (left). There is no rebound, no guarding,  no tenderness at McBurney's point and negative Murphy's sign. No hernia.  Musculoskeletal: Normal range of motion.  Neurological: He is alert and oriented to person, place, and time. He has normal strength. No cranial nerve deficit or sensory deficit. Coordination normal. GCS eye subscore is 4. GCS verbal subscore is 5. GCS motor subscore is 6.  Skin: Skin is warm, dry and intact. No rash noted. No cyanosis.  Psychiatric: He has a normal mood and affect. His speech is normal and behavior is normal. Thought content normal.  Nursing note and vitals reviewed.    ED Treatments / Results  Labs (all labs ordered are listed, but only abnormal results are displayed) Labs Reviewed   URINALYSIS, ROUTINE W REFLEX MICROSCOPIC - Abnormal; Notable for the following components:      Result Value   APPearance HAZY (*)    Hgb urine dipstick LARGE (*)    Protein, ur 30 (*)    RBC / HPF >50 (*)    All other components within normal limits  CBC WITH DIFFERENTIAL/PLATELET  BASIC METABOLIC PANEL    EKG None  Radiology Ct Renal Stone Study  Result Date: 09/11/2017 CLINICAL DATA:  LEFT flank pain, dysuria for several days. EXAM: CT ABDOMEN AND PELVIS WITHOUT CONTRAST TECHNIQUE: Multidetector CT imaging of the abdomen and pelvis was performed following the standard protocol without IV contrast. COMPARISON:  None. FINDINGS: LOWER CHEST: Lung bases are clear. The visualized heart size is normal. No pericardial effusion. HEPATOBILIARY: Normal. PANCREAS: Normal. SPLEEN: Normal. ADRENALS/URINARY TRACT: Kidneys are orthotopic, demonstrating normal size and morphology. Mild LEFT perinephric fat stranding. No nephrolithiasis, hydronephrosis; limited assessment for renal masses by nonenhanced CT. The unopacified ureters are normal in course and caliber. 2 mm LEFT ureteral vesicular junction calculus. Urinary bladder is partially distended and unremarkable. Normal adrenal glands. STOMACH/BOWEL: The stomach, small and large bowel are normal in course and caliber without inflammatory changes, sensitivity decreased by lack of enteric contrast. Normal appendix. VASCULAR/LYMPHATIC: Aortoiliac vessels are normal in course and caliber. No lymphadenopathy by CT size criteria. REPRODUCTIVE: Normal. OTHER: No intraperitoneal free fluid or free air. MUSCULOSKELETAL: Non-acute. Tiny bone island LEFT femoral head. Small L4-5 and L5-S1 broad-based disc bulges. IMPRESSION: 1. 2 mm LEFT ureterovesicular junction calculus without obstructive uropathy. No nephrolithiasis. Electronically Signed   By: Elon Alas M.D.   On: 09/11/2017 00:41    Procedures Procedures (including critical care time)  Medications  Ordered in ED Medications  oxyCODONE-acetaminophen (PERCOCET/ROXICET) 5-325 MG per tablet 1 tablet (1 tablet Oral Given 09/10/17 2146)  ketorolac (TORADOL) 30 MG/ML injection 30 mg (has no administration in time range)  ondansetron (ZOFRAN-ODT) disintegrating tablet 4 mg (4 mg Oral Given 09/10/17 2146)  HYDROmorphone (DILAUDID) injection 1 mg (1 mg Intravenous Given 09/11/17 0104)     Initial Impression / Assessment and Plan / ED Course  I have reviewed the triage vital signs and the nursing notes.  Pertinent labs & imaging results that were available during my care of the patient were reviewed by me and considered in my medical decision making (see chart for details).     Patient presents to the emergency department for evaluation of flank pain.  Patient has been feeling flank discomfort for the last several days, however tonight he had onset of severe, sharp and stabbing pain in the left flank with some radiation to the left lower abdomen and groin.  Work-up as confirmed distal ureterolithiasis without infection.  Patient comfortable with medication.  Will discharge with Flomax, Percocet, follow-up  with urology.  Final Clinical Impressions(s) / ED Diagnoses   Final diagnoses:  Ureteral colic  Ureterolithiasis    ED Discharge Orders    None       Orpah Greek, MD 09/11/17 0109

## 2017-11-26 ENCOUNTER — Ambulatory Visit
Admission: RE | Admit: 2017-11-26 | Discharge: 2017-11-26 | Disposition: A | Discharge status: Home / Self Care | Payer: No Typology Code available for payment source | Source: Ambulatory Visit | Attending: Family Medicine | Admitting: Family Medicine

## 2017-11-26 ENCOUNTER — Other Ambulatory Visit: Payer: Self-pay | Admitting: Family Medicine

## 2017-11-26 DIAGNOSIS — M545 Low back pain: Secondary | ICD-10-CM

## 2018-09-21 ENCOUNTER — Encounter: Payer: Self-pay | Admitting: Physician Assistant

## 2018-09-21 ENCOUNTER — Ambulatory Visit: Payer: No Typology Code available for payment source | Admitting: Physician Assistant

## 2018-09-21 ENCOUNTER — Other Ambulatory Visit: Payer: Self-pay

## 2018-09-21 VITALS — BP 124/92 | HR 68 | Temp 98.3°F | Ht 68.5 in | Wt 216.2 lb

## 2018-09-21 DIAGNOSIS — K6289 Other specified diseases of anus and rectum: Secondary | ICD-10-CM | POA: Diagnosis not present

## 2018-09-21 DIAGNOSIS — K625 Hemorrhage of anus and rectum: Secondary | ICD-10-CM | POA: Diagnosis not present

## 2018-09-21 MED ORDER — NA SULFATE-K SULFATE-MG SULF 17.5-3.13-1.6 GM/177ML PO SOLN: ORAL | 0 refills | Status: DC

## 2018-09-21 MED ORDER — HYDROCORTISONE ACETATE 25 MG RE SUPP: 25.0000 mg | Freq: Every day | RECTAL | 2 refills | Status: DC

## 2018-09-21 NOTE — Progress Notes (Signed)
Reviewed. I agree with documentation including the assessment and plan.  Chloeanne Poteet L. Chlora Mcbain, MD, MPH 

## 2018-09-21 NOTE — Patient Instructions (Signed)
If you are age 37 or older, your body mass index should be between 23-30. Your Body mass index is 32.4 kg/m. If this is out of the aforementioned range listed, please consider follow up with your Primary Care Provider.  If you are age 33 or younger, your body mass index should be between 19-25. Your Body mass index is 32.4 kg/m. If this is out of the aformentioned range listed, please consider follow up with your Primary Care Provider.   You have been scheduled for a colonoscopy. Please follow written instructions given to you at your visit today.  Please pick up your prep supplies at the pharmacy within the next 1-3 days. If you use inhalers (even only as needed), please bring them with you on the day of your procedure. Your physician has requested that you go to www.startemmi.com and enter the access code given to you at your visit today. This web site gives a general overview about your procedure. However, you should still follow specific instructions given to you by our office regarding your preparation for the procedure.  We have sent the following medications to your pharmacy for you to pick up at your convenience: Suprep Anusol suppositories  Thank you for choosing me and [facility].   Amy Esterwood, PA-C

## 2018-09-21 NOTE — Progress Notes (Signed)
Subjective:    Patient ID: Jeremy Mendoza, male    DOB: [birth year], 38 y.o.   MRN: [id number]  HPI Jeremy Mendoza is a pleasant 38 year old male, new to GI today referred by Dr. Jackson Latino for evaluation of rectal pain and bleeding.  Patient is otherwise been in generally good health. He relates that he was seen he believes by Dr. Benson Norway a couple of years ago for similar symptoms and was given a suppository to use, with plans for colonoscopy if symptoms were persisting.  Patient says he did not go back for follow-up.  He apparently also was seen by a surgeon within the past couple of years and had what sounds like one banding procedure which he says helped for a little while. Patient has had his current symptoms over the past 3 years intermittently, and feels that symptoms have worsened over the past several months.  He has rectal discomfort intermittently which seems to be worse with sitting for a prolonged period of times and also if he becomes constipated.  He also notes that he will have more bleeding if he is constipated and has straining.  He does not feel that he is having constipation on any sort of regular basis.  He very frequently will have bright red blood with bowel movements, and says this is been almost daily over the past 3 weeks with some blood in the commode and on the tissue.  Currently he is hurting internally and when I attempted to do digital exam said he was hurting more on the left. He denies any abdominal pain or discomfort. Family history is negative for colon cancer and polyps as far as he is aware.  Review of Systems Pertinent positive and negative review of systems were noted in the above HPI section.  All other review of systems was otherwise negative.  Outpatient Encounter Medications as of 09/21/2018  Medication Sig  . hydrocortisone (ANUSOL-HC) 25 MG suppository Place 1 suppository (25 mg total) rectally at bedtime. Use for 10 days.  Repeat as needed.  . Na Sulfate-K Sulfate-Mg  Sulf 17.5-3.13-1.6 GM/177ML SOLN Suprep-Use as directed  . ondansetron (ZOFRAN ODT) 4 MG disintegrating tablet Take 1 tablet (4 mg total) by mouth every 8 (eight) hours as needed for nausea or vomiting. (Patient not taking: Reported on 09/21/2018)  . [DISCONTINUED] oxyCODONE-acetaminophen (PERCOCET) 5-325 MG tablet Take 1-2 tablets by mouth every 4 (four) hours as needed.  . [DISCONTINUED] pantoprazole (PROTONIX) 40 MG tablet Take 1 tablet (40 mg total) by mouth daily. (Patient not taking: Reported on 09/21/2018)  . [DISCONTINUED] predniSONE (STERAPRED UNI-PAK 21 TAB) 10 MG (21) TBPK tablet Take by mouth daily. Take 6 tabs by mouth daily  for 2 days, then 5 tabs for 2 days, then 4 tabs for 2 days, then 3 tabs for 2 days, 2 tabs for 2 days, then 1 tab by mouth daily for 2 days  . [DISCONTINUED] sucralfate (CARAFATE) 1 G tablet Take 1 tablet (1 g total) by mouth 4 (four) times daily -  with meals and at bedtime. (Patient not taking: Reported on 09/21/2018)  . [DISCONTINUED] tamsulosin (FLOMAX) 0.4 MG CAPS capsule Take 1 capsule (0.4 mg total) by mouth daily.   No facility-administered encounter medications on file as of 09/21/2018.    No Known Allergies There are no active problems to display for this patient.  Social History   Socioeconomic History  . Marital status: Married    Spouse name: Not on file  . Number of children: 0  .  Years of education: Not on file  . Highest education level: Not on file  Occupational History  . Occupation: self employed  Social Needs  . Financial resource strain: Not on file  . Food insecurity    Worry: Not on file    Inability: Not on file  . Transportation needs    Medical: Not on file    Non-medical: Not on file  Tobacco Use  . Smoking status: Current Some Day Smoker    Types: Cigarettes  . Smokeless tobacco: Never Used  . Tobacco comment: social  Substance and Sexual Activity  . Alcohol use: No  . Drug use: No  . Sexual activity: Not on file   Lifestyle  . Physical activity    Days per week: Not on file    Minutes per session: Not on file  . Stress: Not on file  Relationships  . Social Herbalist on phone: Not on file    Gets together: Not on file    Attends religious service: Not on file    Active member of club or organization: Not on file    Attends meetings of clubs or organizations: Not on file    Relationship status: Not on file  . Intimate partner violence    Fear of current or ex partner: Not on file    Emotionally abused: Not on file    Physically abused: Not on file    Forced sexual activity: Not on file  Other Topics Concern  . Not on file  Social History Narrative   Originally from [place].      Relocated from [place] to [place]    Mr. Hsiung family history includes Liver cancer in his paternal aunt; Lung cancer in his father; Prostate cancer in his father.      Objective:    Vitals:   09/21/18 1347  BP: (!) 124/92  Pulse: 68  Temp: 98.3 F (36.8 C)    Physical Exam Well-developed well-nourished male in no acute distress. Weight,216 BMI 32.4  HEENT; nontraumatic normocephalic, EOMI, PER R LA, sclera anicteric. Oropharynx; not examined/wearing mask/COVID Neck; supple, no JVD Cardiovascular; regular rate and rhythm with S1-S2, no murmur rub or gallop Pulmonary; Clear bilaterally Abdomen; soft, nontender, nondistended, no palpable mass or hepatosplenomegaly, bowel sounds are active Rectal; no external hemorrhoids noted, no obvious anal fissure.  Patient exquisitely tender, could not do digital exam Skin; benign exam, no jaundice rash or appreciable lesions Extremities; no clubbing cyanosis or edema skin warm and dry Neuro/Psych; alert and oriented x4, grossly nonfocal mood and affect appropriate       Assessment & Plan:   #[age] year old male with 3-year history of intermittent rectal bleeding and discomfort.  Symptoms have worsened over the past several months and  has noted almost daily bleeding over the past 3 weeks. Etiology of bleeding is not clear, rule out anal fissure ( unable to do digital exam today due to discomfort), rule out internal hemorrhoids, rule out proctitis, rule out occult colon lesion.  Plan; Patient will be scheduled for colonoscopy with Dr. Tarri Glenn.  Procedure was discussed in detail with the patient including indications risks and benefits and he is agreeable to proceed. We discussed high-fiber diet, and consideration of adding a fiber supplement. Increase water intake to 60 ounces per day. We will send prescription for Anusol [facility] suppositories 1 per rectum nightly x10 days then repeat as needed.  I discussed that if he is found to have a fissure  at the time of colonoscopy that a different type of medication would need to be prescribed. Further plans pending results of colonoscopy.   S  PA-C 09/21/2018   Cc: Lin Landsman, MD

## 2018-10-10 HISTORY — PX: OTHER SURGICAL HISTORY: SHX169

## 2018-10-19 ENCOUNTER — Telehealth: Payer: Self-pay | Admitting: Gastroenterology

## 2018-10-19 NOTE — Telephone Encounter (Signed)

## 2018-10-20 ENCOUNTER — Encounter: Payer: Self-pay | Admitting: Gastroenterology

## 2018-10-20 ENCOUNTER — Other Ambulatory Visit: Payer: Self-pay

## 2018-10-20 ENCOUNTER — Ambulatory Visit (AMBULATORY_SURGERY_CENTER): Payer: No Typology Code available for payment source | Admitting: Gastroenterology

## 2018-10-20 VITALS — BP 103/73 | HR 59 | Temp 98.3°F | Resp 18 | Ht 68.0 in | Wt 216.0 lb

## 2018-10-20 DIAGNOSIS — D124 Benign neoplasm of descending colon: Secondary | ICD-10-CM | POA: Diagnosis not present

## 2018-10-20 DIAGNOSIS — D128 Benign neoplasm of rectum: Secondary | ICD-10-CM | POA: Diagnosis not present

## 2018-10-20 DIAGNOSIS — K602 Anal fissure, unspecified: Secondary | ICD-10-CM | POA: Diagnosis present

## 2018-10-20 DIAGNOSIS — D122 Benign neoplasm of ascending colon: Secondary | ICD-10-CM

## 2018-10-20 DIAGNOSIS — K625 Hemorrhage of anus and rectum: Secondary | ICD-10-CM

## 2018-10-20 HISTORY — PX: COLONOSCOPY: SHX174

## 2018-10-20 MED ORDER — AMBULATORY NON FORMULARY MEDICATION: 1 refills | Status: DC

## 2018-10-20 MED ORDER — SODIUM CHLORIDE 0.9 % IV SOLN: 500.0000 mL | Freq: Once | INTRAVENOUS | Status: DC

## 2018-10-20 NOTE — Progress Notes (Signed)
Called to room to assist during endoscopic procedure.  Patient ID and intended procedure confirmed with present staff. Received instructions for my participation in the procedure from the performing physician.  

## 2018-10-20 NOTE — Patient Instructions (Signed)
Impression/Recommendations:  Polyp handout given to patient. Hemorrhoid handout given to patient. High fiber diet handout given to patient.   Await pathology results. Repeat colonoscopy.  Date to be determined after pathology results reviewed.  Sitz baths 2-3 times daily for pain relief.  Citrucel or Metamucil daily.  Add Miralax 17g dailiy if needed to keep stools soft. Nitroglycerin 0.125% gel applied to rectum 3 times daily for 6-8 weeks (not to be used with concurrent phosphodiesterase inhibitors such as Vigra).  Return in 6-8 weeks if symptoms are not improving.  YOU HAD AN ENDOSCOPIC PROCEDURE TODAY AT [facility]:   Refer to the procedure report that was given to you for any specific questions about what was found during the examination.  If the procedure report does not answer your questions, please call your gastroenterologist to clarify.  If you requested that your care partner not be given the details of your procedure findings, then the procedure report has been included in a sealed envelope for you to review at your convenience later.  YOU SHOULD EXPECT: Some feelings of bloating in the abdomen. Passage of more gas than usual.  Walking can help get rid of the air that was put into your GI tract during the procedure and reduce the bloating. If you had a lower endoscopy (such as a colonoscopy or flexible sigmoidoscopy) you may notice spotting of blood in your stool or on the toilet paper. If you underwent a bowel prep for your procedure, you may not have a normal bowel movement for a few days.  Please Note:  You might notice some irritation and congestion in your nose or some drainage.  This is from the oxygen used during your procedure.  There is no need for concern and it should clear up in a day or so.  SYMPTOMS TO REPORT IMMEDIATELY:   Following lower endoscopy (colonoscopy or flexible sigmoidoscopy):  Excessive amounts of blood in the stool  Significant  tenderness or worsening of abdominal pains  Swelling of the abdomen that is new, acute  Fever of 100F or higher For urgent or emergent issues, a gastroenterologist can be reached at any hour by calling [phone].   DIET:  We do recommend a small meal at first, but then you may proceed to your regular diet.  Drink plenty of fluids but you should avoid alcoholic beverages for 24 hours.  ACTIVITY:  You should plan to take it easy for the rest of today and you should NOT DRIVE or use heavy machinery until tomorrow (because of the sedation medicines used during the test).    FOLLOW UP: Our staff will call the number listed on your records 48-72 hours following your procedure to check on you and address any questions or concerns that you may have regarding the information given to you following your procedure. If we do not reach you, we will leave a message.  We will attempt to reach you two times.  During this call, we will ask if you have developed any symptoms of COVID 19. If you develop any symptoms (ie: fever, flu-like symptoms, shortness of breath, cough etc.) before then, please call [phone].  If you test positive for Covid 19 in the 2 weeks post procedure, please call and report this information to [place].    If any biopsies were taken you will be contacted by phone or by letter within the next 1-3 weeks.  Please call us at [phone] if you have not heard about  the biopsies in 3 weeks.    SIGNATURES/CONFIDENTIALITY: You and/or your care partner have signed paperwork which will be entered into your electronic medical record.  These signatures attest to the fact that that the information above on your After Visit Summary has been reviewed and is understood.  Full responsibility of the confidentiality of this discharge information lies with you and/or your care-partner.

## 2018-10-20 NOTE — Op Note (Addendum)
[place] Patient Name: Jeremy Mendoza Procedure Date: 10/20/2018 1:40 PM MRN: [id number] Endoscopist: Thornton Park MD, MD Age: 38 Referring MD:  Date of Birth: 04/10/1980 Gender: Male Account #: [id number] Procedure:                Colonoscopy Indications:              Rectal bleeding and rectal pain Medicines:                See the Anesthesia note for documentation of the                            administered medications Procedure:                Pre-Anesthesia Assessment:                           - Prior to the procedure, a History and Physical                            was performed, and patient medications and                            allergies were reviewed. The patient's tolerance of                            previous anesthesia was also reviewed. The risks                            and benefits of the procedure and the sedation                            options and risks were discussed with the patient.                            All questions were answered, and informed consent                            was obtained. Prior Anticoagulants: The patient has                            taken no previous anticoagulant or antiplatelet                            agents. ASA Grade Assessment: II - A patient with                            mild systemic disease. After reviewing the risks                            and benefits, the patient was deemed in                            satisfactory condition to undergo the procedure.  After obtaining informed consent, the colonoscope                            was passed under direct vision. Throughout the                            procedure, the patient's blood pressure, pulse, and                            oxygen saturations were monitored continuously. The                            Colonoscope was introduced through the anus and                            advanced to the the terminal  ileum, with                            identification of the appendiceal orifice and IC                            valve. A second forward view of the right colon was                            performed. The colonoscopy was performed without                            difficulty. The patient tolerated the procedure                            well. The quality of the bowel preparation was                            good. The terminal ileum, ileocecal valve,                            appendiceal orifice, and rectum were photographed. Scope In: 1:57:14 PM Scope Out: 2:09:36 PM Scope Withdrawal Time: 0 hours 10 minutes 49 seconds  Total Procedure Duration: 0 hours 12 minutes 22 seconds  Findings:                 Two sessile polyps were found in the ascending                            colon. The polyps were 1 to 2 mm in size. These                            polyps were removed with a cold snare. Resection                            and retrieval were complete. Estimated blood loss  was minimal.                           A less than 1 mm polyp was found in the ascending                            colon. The polyp was sessile. I was unable to                            resect it with a cold snare despite multiple                            attempts because it was so small and flat. The                            polyp was removed with a cold biopsy forceps.                            Resection and retrieval were complete. Estimated                            blood loss was minimal.                           A 2 mm polyp was found in the descending colon. The                            polyp was sessile. The polyp was removed with a                            cold snare. Resection and retrieval were complete.                           A medium submucosal nodule was found in the rectum.                            The polyp was sessile. Tunneled biopsies were                             attempted with a cold forceps for histology. It                            appeared to have an extensive vascular supply.                            Estimated blood loss was minimal.                           An anal fissure was found in the anal canal. It was                            bleeding at the time of the exam.  Non-bleeding internal hemorrhoids were found.                           The exam was otherwise without abnormality on                            direct and retroflexion views. Complications:            No immediate complications. Estimated blood loss:                            Minimal. Estimated Blood Loss:     Estimated blood loss was minimal. Impression:               - Two 1 to 2 mm polyps in the ascending colon,                            removed with a cold snare. Resected and retrieved.                           - One less than 1 mm polyp in the ascending colon,                            removed with a cold biopsy forceps. Resected and                            retrieved.                           - One 2 mm polyp in the descending colon, removed                            with a cold snare. Resected and retrieved.                           - One medium polyp in the rectum. Biopsied.                           - Anal fissure with active bleeding on exam today.                           - Non-bleeding internal hemorrhoids.                           - The examination was otherwise normal on direct                            and retroflexion views. Recommendation:           - Patient has a contact number available for                            emergencies. The signs and symptoms of potential  delayed complications were discussed with the                            patient. Return to normal activities tomorrow.                            Written discharge instructions were provided to the                             patient.                           - Await pathology results.                           - Repeat colonoscopy date to be determined after                            pending pathology results are reviewed for                            surveillance based on pathology results.                           - Sitz baths two to three times daily for pain                            relief                           - High fiber diet - increasing both dietary fiber                            (35 grams daily) and water intake                           - Citrucel or Metamucil daily, add Miralax 17 g                            daily if needed to keep stools soft                           - Nitroglycerin 0.125% gel applied to the rectum                            TID x 6-8 weeks (not to be used with concurrent                            phosphodiesterase inhibitors such as Viagra)                           - Return in 6-8 weeks if symptoms are not improving Thornton Park MD, MD 10/20/2018 2:22:42 PM This report has been signed electronically.

## 2018-10-20 NOTE — Progress Notes (Signed)
PT taken to PACU. Monitors in place. VSS. Report given to RN. 

## 2018-10-20 NOTE — Progress Notes (Signed)
Pt's states no medical or surgical changes since previsit or office visit. 

## 2018-10-22 ENCOUNTER — Telehealth: Payer: Self-pay

## 2018-10-22 ENCOUNTER — Encounter: Payer: Self-pay | Admitting: *Deleted

## 2018-10-22 NOTE — Telephone Encounter (Signed)
  Follow up Call-  Call back number 10/20/2018  Post procedure Call Back phone  # [phone]  Permission to leave phone message Yes  Some recent data might be hidden     Patient questions:  Do you have a fever, pain , or abdominal swelling? No. Pain Score  0 *  Have you tolerated food without any problems? Yes.    Have you been able to return to your normal activities? Yes.    Do you have any questions about your discharge instructions: Diet   No. Medications  No. Follow up visit  No.  Do you have questions or concerns about your Care? No.  Actions: * If pain score is 4 or above: No action needed, pain <4.  1. Have you developed a fever since your procedure?no  2.   Have you had an respiratory symptoms (SOB or cough) since your procedure? no  3.   Have you tested positive for COVID 19 since your procedure no 4.   Have you had any family members/close contacts diagnosed with the COVID 19 since your procedure?  no   If yes to any of these questions please route to Joylene John, RN and Alphonsa Gin, Therapist, sports.

## 2018-10-26 ENCOUNTER — Encounter: Payer: Self-pay | Admitting: Gastroenterology

## 2018-12-01 ENCOUNTER — Ambulatory Visit: Payer: No Typology Code available for payment source | Admitting: Gastroenterology

## 2018-12-01 ENCOUNTER — Encounter: Payer: Self-pay | Admitting: Gastroenterology

## 2018-12-01 ENCOUNTER — Other Ambulatory Visit: Payer: Self-pay

## 2018-12-01 VITALS — BP 118/80 | HR 59 | Temp 97.8°F | Ht 68.5 in | Wt 213.6 lb

## 2018-12-01 DIAGNOSIS — K602 Anal fissure, unspecified: Secondary | ICD-10-CM | POA: Diagnosis not present

## 2018-12-01 NOTE — Patient Instructions (Addendum)
Your fissure is better but it is still there. Please continue to take nitroglycerin 0.125% gel applied to the rectum 3 times daily for 6-8 weeks.   In addition to your hemorrhoids:  1. Eat 25-30 g of fiber in your diet through fruits, vegetables, and bran fibers 2. Sitz bath after a bowel movement will help with the pain (epsome salt in a bathtub with warm water, sit in the warm water 10 minutes up to twice a day as needed) 3. Add Citrucel, Benefiber, or Metamucil 1 tblsp twice daily, titrate to a soft, formed stool 4. Miralax 1 capful daily if needed to keep stools soft 5. Limit your time on the toilet. Sit no longer than 5 minutes. Resist straining. 6. Before wiping after a bowel movement, swipe the paper into a jar of vaseline to give the paper a slight covering of the vaseline until fully healed. This will help reduce irriation and discomfort to the area.  Call if no improvement in your bleeding after 6 more weeks of nitrogylcerin. Will plan surgical consultation if you are not better within 6 weeks.   You need to have another colonoscopy in 2023. Your brothers should start colon cancer screening at age 68, or earlier with any new symptoms.   Fiber Chart  You should be consuming 25-30g of fiber per day and drinking 8 glasses of water to help your bowels move regularly.  In the chart below you can look up how much fiber you are getting in an average day.  If you are not getting enough fiber, you should add a fiber supplement to your diet.  Examples of this include Metamucil, FiberCon and Citrucel.  These can be purchased at your local grocery store or pharmacy.      [email or web address]

## 2018-12-01 NOTE — Progress Notes (Signed)
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  Patient concern about a bump at his rectum is actually the persistent fissure.  I recommended an additional 6 weeks of local therapy.  In addition we reviewed the need for bulky, soft bowel movements without straining and discussed lifestyle modifications to achieve this goal.  If the bleeding has not resolved at that time we will proceed with surgical referral.  He had 4 tubular adenomas removed at the time of his recent colonoscopy.  Given these results I recommended a surveillance colonoscopy in 3 years.  I have also recommended that first-degree family member start colon cancer screening early.   PLAN: - High-fiber diet - Addition of a daily stool bulking agent such as Metamucil, Benefiber, or Citrucel recommended - Continue nitroglycerin 0.125% gel applied to the rectum 3 times daily for 6 to 8 weeks.  - Surgical consultation if bleeding does not improve after 6 more weeks - Surveillance colonoscopy in 2023 - First degree family members should start colon cancer screening   Please see the "Patient Instructions" section for addition details about the plan.  HPI: Jeremy Mendoza is a 38 y.o. male returns in follow-up after his recent endoscopy.  He was initially seen in consultation by PA [place].  The interval history is obtained through the patient review of his electronic health record.  Colonoscopy 10/20/2018 showed 5 small polyps and a posterior anal fissure.  He was treated with sits  baths, high-fiber diet, addition of a daily stool bulking agent such as Metamucil, and nitroglycerin 0.125% gel applied to the rectum 3 times daily for 6 to 8 weeks.  For these polyps were tubular adenomas and a surveillance was recommended in 3 years.  Continues to have bleeding with every bowel movement although the volume of blood has improved. The rectal pain has largely improvement.   He remains concerned about a bump that he feels at his anus. No other associated symptoms. No identified exacerbating or relieving features.   He has increased the fiber in his diet following the colonoscopy. He has not added any stool bulking agents.    Past Medical History:  Diagnosis Date  . Anal fissure   . Anxiety   . Hemorrhoids   . Kidney stones     Past Surgical History:  Procedure Laterality Date  . NO PAST SURGERIES      Current Outpatient Medications  Medication Sig Dispense Refill  . AMBULATORY NON FORMULARY MEDICATION Medication Name: Nitroglycerin 0.125% gel. Apply pea size amount to the rectum three times a day for 6-8 weeks 30 g 1  . hydrocortisone (ANUSOL-HC) 25 MG suppository Place 1 suppository (25 mg total) rectally at bedtime. Use for 10 days.  Repeat as needed. 12 suppository 2  . Na Sulfate-K Sulfate-Mg Sulf 17.5-3.13-1.6 GM/177ML SOLN Suprep-Use as directed 354 mL 0  . ondansetron (ZOFRAN ODT) 4 MG disintegrating tablet Take 1 tablet (4 mg total) by mouth every 8 (eight) hours as needed for nausea or vomiting. (Patient not taking: Reported on 09/21/2018) 20 tablet 0  Current Facility-Administered Medications  Medication Dose Route Frequency Provider Last Rate Last Dose  . 0.9 %  sodium chloride infusion  500 mL Intravenous Once Thornton Park, MD        Allergies as of 12/01/2018  . (No Known Allergies)    Family History  Problem Relation Age of Onset  . Prostate cancer Father   . Lung cancer Father   . Liver cancer Paternal Aunt     Social History    Socioeconomic History  . Marital status: Married    Spouse name: Not on file  . Number of children: 0  . Years of education: Not on file  . Highest education level: Not on file  Occupational History  . Occupation: self employed  Social Needs  . Financial resource strain: Not on file  . Food insecurity    Worry: Not on file    Inability: Not on file  . Transportation needs    Medical: Not on file    Non-medical: Not on file  Tobacco Use  . Smoking status: Current Some Day Smoker    Types: Cigarettes  . Smokeless tobacco: Never Used  . Tobacco comment: social  Substance and Sexual Activity  . Alcohol use: No  . Drug use: No  . Sexual activity: Not on file  Lifestyle  . Physical activity    Days per week: Not on file    Minutes per session: Not on file  . Stress: Not on file  Relationships  . Social Herbalist on phone: Not on file    Gets together: Not on file    Attends religious service: Not on file    Active member of club or organization: Not on file    Attends meetings of clubs or organizations: Not on file    Relationship status: Not on file  . Intimate partner violence    Fear of current or ex partner: Not on file    Emotionally abused: Not on file    Physically abused: Not on file    Forced sexual activity: Not on file  Other Topics Concern  . Not on file  Social History Narrative   Originally from [place].      Relocated from [place] to [place]    Physical Exam: General:   Alert,  well-nourished, pleasant and cooperative in NAD Head:  Normocephalic and atraumatic. Eyes:  Sclera clear, no icterus.   Conjunctiva pink. Ears:  Normal auditory acuity. Nose:  No deformity, discharge,  or lesions. Mouth:  No deformity or lesions.   Abdomen:  Soft,nontender, nondistended, normal bowel sounds, no rebound or guarding. No hepatosplenomegaly.   Rectal: Chaperone: Magda Paganini, CMA  Posterior fissure is persistent but smaller in size compared to  the time of his colonoscopy. The "bump" that he feels in this area is his fissure. No chemical dermatitis. No external hemorrhoids or fistula. No prolapsing hemorrhoids. No rectal prolapse. Normal anocutaneous reflex.  Msk:  Symmetrical. No boney deformities Extremities:  No clubbing or edema. Neurologic:  Alert and  oriented x4;  grossly nonfocal Skin:  Intact without significant lesions or rashes. Psych:  Alert and cooperative. Normal mood and affect.     Isola Mehlman L. Tarri Glenn, MD, MPH 12/01/2018, 12:41 PM

## 2018-12-01 NOTE — Progress Notes (Signed)
Photograph of the anal fissure taken during the visit today.

## 2019-03-05 DIAGNOSIS — I10 Essential (primary) hypertension: Secondary | ICD-10-CM

## 2019-03-05 HISTORY — DX: Essential (primary) hypertension: I10

## 2019-03-19 DIAGNOSIS — U071 COVID-19: Secondary | ICD-10-CM

## 2019-03-19 HISTORY — DX: COVID-19: U07.1

## 2019-05-20 ENCOUNTER — Telehealth: Payer: Self-pay | Admitting: Gastroenterology

## 2019-05-20 NOTE — Telephone Encounter (Signed)
Pt states that anal fisure has been bother him a lot lately and is very painful. He just called to inform that Nitroglycerine needs PA. He would like a call back with some advise on what he can do in the meantime.

## 2019-05-20 NOTE — Telephone Encounter (Signed)
Spoke with patient he agrees to come in and see Tye Savoy 05/25/19 at 1:30 pm. He would like a refill called into the [facility].

## 2019-05-20 NOTE — Telephone Encounter (Signed)
OFfice visit for reassessment. Please let me know if there there is not an appointment with me or an APP within a week. Thanks.

## 2019-05-20 NOTE — Telephone Encounter (Signed)
Pt called to inform that Nitroglycerine needs PA. He states that he called his insurance who told him that they already faxed PA form.

## 2019-05-20 NOTE — Telephone Encounter (Signed)
Patient has not been seen since September 2020 for anal fissure on to refill? Or does patient need OV first with you or an app?

## 2019-05-21 NOTE — Telephone Encounter (Signed)
Sent patient My [employer] message letting him know Dr. Tarri Glenn would like him to come in on 05/25/19 to get reassessed before refilling his Nitroglycerine

## 2019-05-25 ENCOUNTER — Ambulatory Visit (INDEPENDENT_AMBULATORY_CARE_PROVIDER_SITE_OTHER): Payer: No Typology Code available for payment source | Admitting: Nurse Practitioner

## 2019-05-25 ENCOUNTER — Encounter: Payer: Self-pay | Admitting: Nurse Practitioner

## 2019-05-25 ENCOUNTER — Other Ambulatory Visit: Payer: Self-pay

## 2019-05-25 VITALS — BP 110/80 | HR 80 | Temp 98.9°F | Ht 68.5 in | Wt 214.0 lb

## 2019-05-25 DIAGNOSIS — K602 Anal fissure, unspecified: Secondary | ICD-10-CM | POA: Diagnosis not present

## 2019-05-25 DIAGNOSIS — K5909 Other constipation: Secondary | ICD-10-CM

## 2019-05-25 NOTE — Patient Instructions (Signed)
If you are age 38 or older, your body mass index should be between 23-30. Your Body mass index is 32.07 kg/m. If this is out of the aforementioned range listed, please consider follow up with your Primary Care Provider.  If you are age 23 or younger, your body mass index should be between 19-25. Your Body mass index is 32.07 kg/m. If this is out of the aformentioned range listed, please consider follow up with your Primary Care Provider.   Continue Fiber.  Add Miralax daily. (this is over-the-counter)  You are being referred to [facility].  They will contact you with an appointment date.  Thank you for choosing me and [facility].   Tye Savoy, NP

## 2019-05-25 NOTE — Progress Notes (Signed)
           IMPRESSION and PLAN:    39 year old male with history of anal fissure, colon polyps  # Chronic posterior midline anal fissure --documented on colonoscopy August 2020, again Sept 2020 --Has completed several courses of topical NTG ointment which helps but doesn't alleviated symptoms. --Will refer for surgical evaluation --He does suffer with constipation from time to time and this was addressed today.  Continue Metamucil, continue good hydration.  I have asked him to add a dose of MiraLAX daily  HPI:    Primary GI: Dr. Tarri Glenn  Chief complaint : Recurrent anal pain and rectal bleed  Patient is a 39 year old male who we have been following for anal fissures.  Patient says he has struggled with anal fissures for 3 to 4 years.  He has used nitroglycerin ointment multiple times, this last time  ( last September) he completed  about 16 weeks of treatment he says.  Nitroglycerin ointment helped but did not alleviate his symptoms.  Then with one episode of constipation the rectal bleeding and pain begun much worse again.  He is taking daily Metamucil.  Patient says he is very careful with his diet, even tries to avoid meat to keep from becoming constipated .  He says that this fissure is diminishing his quality of life.  In the past he has tried to avoid surgery but is now ready to proceed with what ever it takes to get better  Review of systems:     No chest pain, no SOB, no fevers, no urinary sx   Past Medical History:  Diagnosis Date  . Anal fissure   . Anxiety   . Hemorrhoids   . Kidney stones     Patient's surgical history, family medical history, social history, medications and allergies were all reviewed in Epic   Creatinine clearance cannot be calculated (Patient's most recent lab result is older than the maximum 21 days allowed.)  No current outpatient medications on file.   No current facility-administered medications for this visit.    Physical Exam:      BP 110/80   Pulse 80   Temp 98.9 F (37.2 C)   Ht 5' 8.5" (1.74 m)   Wt 214 lb (97.1 kg)   BMI 32.07 kg/m   GENERAL:  Pleasant male in NAD PSYCH: : Cooperative, normal affect RECTAL: tiny perianal white bump at posterior midline. Unable to perform complete DRE. Partial DRE revealed significant discomfort at posterior midline  NEURO: Alert and oriented x 3, no focal neurologic deficits   Tye Savoy , NP 05/25/2019, 1:43 PM

## 2019-05-26 NOTE — Progress Notes (Signed)
Reviewed and agree with management plans. Agree with surgical consultation for persistent anal fissure.   Naylee Frankowski L. Tarri Glenn, MD, MPH

## 2019-06-18 ENCOUNTER — Telehealth: Payer: Self-pay | Admitting: Nurse Practitioner

## 2019-06-18 NOTE — Telephone Encounter (Signed)
Patient is calling states he was referred to [facility] and they gave him an appt for June he is requesting for [place] to call them to see if he can get in sooner

## 2019-06-22 ENCOUNTER — Ambulatory Visit: Payer: Self-pay | Admitting: General Surgery

## 2019-06-24 NOTE — Telephone Encounter (Signed)
Patient was seen by surgeon 06/07/2019. Surgery is scheduled for June. This is the soonest they can do.

## 2019-06-30 ENCOUNTER — Encounter (HOSPITAL_BASED_OUTPATIENT_CLINIC_OR_DEPARTMENT_OTHER): Payer: Self-pay | Admitting: General Surgery

## 2019-06-30 ENCOUNTER — Other Ambulatory Visit: Payer: Self-pay

## 2019-06-30 NOTE — Progress Notes (Addendum)
Spoke w/ via phone for pre-op interview---PATIENT Lab needs dos----I STAT 8, EKG              COVID test ------07-03-2019 AT 1050 AM Arrive at -------1015 AM 07-07-2019 NPO after ------MIDNIGHT Medications to take morning of surgery -----NONE Diabetic medication -----NONE Patient Special Instructions -----NONE Pre-Op special Istructions -----NONE Patient verbalized understanding of instructions that were given at this phone interview. Patient denies shortness of breath, chest pain, fever, cough a this phone interview.

## 2019-07-03 ENCOUNTER — Other Ambulatory Visit (HOSPITAL_COMMUNITY)
Admission: RE | Admit: 2019-07-03 | Discharge: 2019-07-03 | Disposition: A | Discharge status: Home / Self Care | Payer: No Typology Code available for payment source | Source: Ambulatory Visit | Attending: General Surgery | Admitting: General Surgery

## 2019-07-03 DIAGNOSIS — Z01812 Encounter for preprocedural laboratory examination: Secondary | ICD-10-CM | POA: Insufficient documentation

## 2019-07-03 DIAGNOSIS — Z20822 Contact with and (suspected) exposure to covid-19: Secondary | ICD-10-CM | POA: Diagnosis not present

## 2019-07-03 LAB — SARS CORONAVIRUS 2 (TAT 6-24 HRS): SARS Coronavirus 2: NEGATIVE

## 2019-07-07 ENCOUNTER — Ambulatory Visit (HOSPITAL_BASED_OUTPATIENT_CLINIC_OR_DEPARTMENT_OTHER): Payer: No Typology Code available for payment source | Admitting: Anesthesiology

## 2019-07-07 ENCOUNTER — Encounter (HOSPITAL_BASED_OUTPATIENT_CLINIC_OR_DEPARTMENT_OTHER): Payer: Self-pay | Admitting: General Surgery

## 2019-07-07 ENCOUNTER — Encounter (HOSPITAL_BASED_OUTPATIENT_CLINIC_OR_DEPARTMENT_OTHER)
Admission: RE | Disposition: A | Discharge status: Home / Self Care | Payer: Self-pay | Source: Home / Self Care | Attending: General Surgery

## 2019-07-07 ENCOUNTER — Other Ambulatory Visit: Payer: Self-pay

## 2019-07-07 ENCOUNTER — Ambulatory Visit (HOSPITAL_BASED_OUTPATIENT_CLINIC_OR_DEPARTMENT_OTHER)
Admission: RE | Admit: 2019-07-07 | Discharge: 2019-07-07 | Disposition: A | Discharge status: Home / Self Care | Payer: No Typology Code available for payment source | Attending: General Surgery | Admitting: General Surgery

## 2019-07-07 DIAGNOSIS — Z6831 Body mass index (BMI) 31.0-31.9, adult: Secondary | ICD-10-CM | POA: Diagnosis not present

## 2019-07-07 DIAGNOSIS — K601 Chronic anal fissure: Secondary | ICD-10-CM | POA: Insufficient documentation

## 2019-07-07 DIAGNOSIS — I10 Essential (primary) hypertension: Secondary | ICD-10-CM | POA: Insufficient documentation

## 2019-07-07 DIAGNOSIS — Z8616 Personal history of COVID-19: Secondary | ICD-10-CM | POA: Diagnosis not present

## 2019-07-07 DIAGNOSIS — E669 Obesity, unspecified: Secondary | ICD-10-CM | POA: Insufficient documentation

## 2019-07-07 HISTORY — PX: RECTAL EXAM UNDER ANESTHESIA: SHX6399

## 2019-07-07 HISTORY — DX: Personal history of urinary calculi: Z87.442

## 2019-07-07 HISTORY — PX: SPHINCTEROTOMY: SHX5279

## 2019-07-07 LAB — POCT I-STAT, CHEM 8
BUN: 16 mg/dL (ref 6–20)
Calcium, Ion: 1.29 mmol/L (ref 1.15–1.40)
Chloride: 103 mmol/L (ref 98–111)
Creatinine, Ser: 1 mg/dL (ref 0.61–1.24)
Glucose, Bld: 106 mg/dL — ABNORMAL HIGH (ref 70–99)
HCT: 43 % (ref 39.0–52.0)
Hemoglobin: 14.6 g/dL (ref 13.0–17.0)
Potassium: 4 mmol/L (ref 3.5–5.1)
Sodium: 139 mmol/L (ref 135–145)
TCO2: 24 mmol/L (ref 22–32)

## 2019-07-07 SURGERY — EXAM UNDER ANESTHESIA, RECTUM
Anesthesia: Monitor Anesthesia Care | Site: Rectum

## 2019-07-07 MED ORDER — ONDANSETRON HCL 4 MG/2ML IJ SOLN
INTRAMUSCULAR | Status: AC
  Filled 2019-07-07: qty 2

## 2019-07-07 MED ORDER — OXYCODONE HCL 5 MG/5ML PO SOLN: 5.0000 mg | Freq: Once | ORAL | Status: DC | PRN

## 2019-07-07 MED ORDER — ACETAMINOPHEN 325 MG RE SUPP: 650.0000 mg | RECTAL | Status: DC | PRN

## 2019-07-07 MED ORDER — ACETAMINOPHEN 325 MG PO TABS: 650.0000 mg | ORAL_TABLET | ORAL | Status: DC | PRN

## 2019-07-07 MED ORDER — FENTANYL CITRATE (PF) 100 MCG/2ML IJ SOLN: 25.0000 ug | INTRAMUSCULAR | Status: DC | PRN

## 2019-07-07 MED ORDER — PROMETHAZINE HCL 25 MG/ML IJ SOLN: 6.2500 mg | INTRAMUSCULAR | Status: DC | PRN

## 2019-07-07 MED ORDER — DEXAMETHASONE SODIUM PHOSPHATE 10 MG/ML IJ SOLN
INTRAMUSCULAR | Status: AC
  Filled 2019-07-07: qty 1

## 2019-07-07 MED ORDER — LIDOCAINE HCL (CARDIAC) PF 100 MG/5ML IV SOSY
PREFILLED_SYRINGE | INTRAVENOUS | Status: DC | PRN
  Administered 2019-07-07: 60 mg via INTRAVENOUS

## 2019-07-07 MED ORDER — ONABOTULINUMTOXINA 100 UNITS IJ SOLR
INTRAMUSCULAR | Status: DC | PRN
  Administered 2019-07-07: 100 [IU] via INTRAMUSCULAR

## 2019-07-07 MED ORDER — SODIUM CHLORIDE (PF) 0.9 % IJ SOLN
INTRAMUSCULAR | Status: DC | PRN
  Administered 2019-07-07: 50 mL

## 2019-07-07 MED ORDER — SODIUM CHLORIDE 0.9% FLUSH: 3.0000 mL | INTRAVENOUS | Status: DC | PRN

## 2019-07-07 MED ORDER — MIDAZOLAM HCL 5 MG/5ML IJ SOLN
INTRAMUSCULAR | Status: DC | PRN
  Administered 2019-07-07: 2 mg via INTRAVENOUS

## 2019-07-07 MED ORDER — BUPIVACAINE-EPINEPHRINE 0.5% -1:200000 IJ SOLN
INTRAMUSCULAR | Status: DC | PRN
  Administered 2019-07-07: 30 mL

## 2019-07-07 MED ORDER — PROPOFOL 10 MG/ML IV BOLUS
INTRAVENOUS | Status: AC
  Filled 2019-07-07: qty 20

## 2019-07-07 MED ORDER — TRAMADOL HCL 50 MG PO TABS: 50.0000 mg | ORAL_TABLET | Freq: Four times a day (QID) | ORAL | 0 refills | Status: DC | PRN

## 2019-07-07 MED ORDER — LACTATED RINGERS IV SOLN: INTRAVENOUS | Status: DC

## 2019-07-07 MED ORDER — PROPOFOL 500 MG/50ML IV EMUL
INTRAVENOUS | Status: DC | PRN
  Administered 2019-07-07: 100 ug/kg/min via INTRAVENOUS

## 2019-07-07 MED ORDER — LIDOCAINE 2% (20 MG/ML) 5 ML SYRINGE
INTRAMUSCULAR | Status: AC
  Filled 2019-07-07: qty 5

## 2019-07-07 MED ORDER — SODIUM CHLORIDE 0.9 % IV SOLN: 250.0000 mL | INTRAVENOUS | Status: DC | PRN

## 2019-07-07 MED ORDER — ACETAMINOPHEN 500 MG PO TABS
1000.0000 mg | ORAL_TABLET | ORAL | Status: AC
  Administered 2019-07-07: 1000 mg via ORAL

## 2019-07-07 MED ORDER — PROPOFOL 10 MG/ML IV BOLUS
INTRAVENOUS | Status: DC | PRN
  Administered 2019-07-07 (×3): 20 mg via INTRAVENOUS
  Administered 2019-07-07: 30 mg via INTRAVENOUS

## 2019-07-07 MED ORDER — DEXAMETHASONE SODIUM PHOSPHATE 4 MG/ML IJ SOLN
INTRAMUSCULAR | Status: DC | PRN
  Administered 2019-07-07: 5 mg via INTRAVENOUS

## 2019-07-07 MED ORDER — ONDANSETRON HCL 4 MG/2ML IJ SOLN
INTRAMUSCULAR | Status: DC | PRN
  Administered 2019-07-07: 4 mg via INTRAVENOUS

## 2019-07-07 MED ORDER — FENTANYL CITRATE (PF) 100 MCG/2ML IJ SOLN
INTRAMUSCULAR | Status: DC | PRN
  Administered 2019-07-07 (×4): 25 ug via INTRAVENOUS

## 2019-07-07 MED ORDER — OXYCODONE HCL 5 MG PO TABS: 5.0000 mg | ORAL_TABLET | Freq: Once | ORAL | Status: DC | PRN

## 2019-07-07 MED ORDER — FENTANYL CITRATE (PF) 100 MCG/2ML IJ SOLN
INTRAMUSCULAR | Status: AC
  Filled 2019-07-07: qty 2

## 2019-07-07 MED ORDER — SODIUM CHLORIDE 0.9% FLUSH: 3.0000 mL | Freq: Two times a day (BID) | INTRAVENOUS | Status: DC

## 2019-07-07 MED ORDER — MIDAZOLAM HCL 2 MG/2ML IJ SOLN
INTRAMUSCULAR | Status: AC
  Filled 2019-07-07: qty 2

## 2019-07-07 MED ORDER — ACETAMINOPHEN 500 MG PO TABS
ORAL_TABLET | ORAL | Status: AC
  Filled 2019-07-07: qty 2

## 2019-07-07 MED ORDER — OXYCODONE HCL 5 MG PO TABS: 5.0000 mg | ORAL_TABLET | ORAL | Status: DC | PRN

## 2019-07-07 SURGICAL SUPPLY — 57 items
BENZOIN TINCTURE PRP APPL 2/3 (GAUZE/BANDAGES/DRESSINGS) ×6 IMPLANT
BLADE EXTENDED COATED 6.5IN (ELECTRODE) IMPLANT
BLADE HEX COATED 2.75 (ELECTRODE) ×3 IMPLANT
BLADE SURG 10 STRL SS (BLADE) IMPLANT
BLADE SURG 15 STRL LF DISP TIS (BLADE) ×1 IMPLANT
BLADE SURG 15 STRL SS (BLADE) ×2
BRIEF STRETCH FOR OB PAD LRG (UNDERPADS AND DIAPERS) ×3 IMPLANT
CANISTER SUCT 3000ML PPV (MISCELLANEOUS) ×3 IMPLANT
COVER BACK TABLE 60X90IN (DRAPES) ×3 IMPLANT
COVER MAYO STAND STRL (DRAPES) ×3 IMPLANT
COVER WAND RF STERILE (DRAPES) ×3 IMPLANT
DECANTER SPIKE VIAL GLASS SM (MISCELLANEOUS) ×3 IMPLANT
DRAPE LAPAROTOMY 100X72 PEDS (DRAPES) ×3 IMPLANT
DRAPE UTILITY XL STRL (DRAPES) ×3 IMPLANT
DRSG PAD ABDOMINAL 8X10 ST (GAUZE/BANDAGES/DRESSINGS) ×3 IMPLANT
ELECT REM PT RETURN 9FT ADLT (ELECTROSURGICAL) ×3
ELECTRODE REM PT RTRN 9FT ADLT (ELECTROSURGICAL) ×1 IMPLANT
GAUZE SPONGE 4X4 12PLY STRL (GAUZE/BANDAGES/DRESSINGS) ×3 IMPLANT
GLOVE BIO SURGEON STRL SZ 6.5 (GLOVE) ×2 IMPLANT
GLOVE BIO SURGEONS STRL SZ 6.5 (GLOVE) ×1
GLOVE BIOGEL PI IND STRL 7.0 (GLOVE) ×1 IMPLANT
GLOVE BIOGEL PI INDICATOR 7.0 (GLOVE) ×2
GOWN STRL REUS W/TWL 2XL LVL3 (GOWN DISPOSABLE) ×3 IMPLANT
GOWN STRL REUS W/TWL XL LVL3 (GOWN DISPOSABLE) ×3 IMPLANT
HYDROGEN PEROXIDE 16OZ (MISCELLANEOUS) ×3 IMPLANT
IV CATH 14GX2 1/4 (CATHETERS) ×3 IMPLANT
IV CATH 18G SAFETY (IV SOLUTION) ×3 IMPLANT
KIT SIGMOIDOSCOPE (SET/KITS/TRAYS/PACK) IMPLANT
KIT TURNOVER CYSTO (KITS) ×3 IMPLANT
LOOP VESSEL MAXI BLUE (MISCELLANEOUS) IMPLANT
NDL SAFETY ECLIPSE 18X1.5 (NEEDLE) IMPLANT
NEEDLE HYPO 18GX1.5 SHARP (NEEDLE)
NEEDLE HYPO 22GX1.5 SAFETY (NEEDLE) ×3 IMPLANT
NS IRRIG 500ML POUR BTL (IV SOLUTION) ×3 IMPLANT
PAD ARMBOARD 7.5X6 YLW CONV (MISCELLANEOUS) IMPLANT
PENCIL BUTTON HOLSTER BLD 10FT (ELECTRODE) ×3 IMPLANT
SET BASIN DAY SURGERY F.S. (CUSTOM PROCEDURE TRAY) ×3 IMPLANT
SPONGE HEMORRHOID 8X3CM (HEMOSTASIS) IMPLANT
SPONGE SURGIFOAM ABS GEL 12-7 (HEMOSTASIS) IMPLANT
SUCTION FRAZIER HANDLE 10FR (MISCELLANEOUS)
SUCTION TUBE FRAZIER 10FR DISP (MISCELLANEOUS) IMPLANT
SUT CHROMIC 2 0 SH (SUTURE) IMPLANT
SUT CHROMIC 3 0 SH 27 (SUTURE) IMPLANT
SUT ETHIBOND 0 (SUTURE) IMPLANT
SUT VIC AB 2-0 SH 27 (SUTURE)
SUT VIC AB 2-0 SH 27XBRD (SUTURE) IMPLANT
SUT VIC AB 3-0 SH 18 (SUTURE) IMPLANT
SUT VIC AB 3-0 SH 27 (SUTURE)
SUT VIC AB 3-0 SH 27XBRD (SUTURE) IMPLANT
SYR CONTROL 10ML LL (SYRINGE) ×3 IMPLANT
TOWEL OR 17X26 10 PK STRL BLUE (TOWEL DISPOSABLE) ×3 IMPLANT
TRAY DSU PREP LF (CUSTOM PROCEDURE TRAY) ×3 IMPLANT
TUBE CONNECTING 12'X1/4 (SUCTIONS) ×1
TUBE CONNECTING 12X1/4 (SUCTIONS) ×2 IMPLANT
UNDERPAD 30X30 (UNDERPADS AND DIAPERS) ×3 IMPLANT
WATER STERILE IRR 500ML POUR (IV SOLUTION) ×3 IMPLANT
YANKAUER SUCT BULB TIP NO VENT (SUCTIONS) ×3 IMPLANT

## 2019-07-07 NOTE — Anesthesia Procedure Notes (Signed)
Procedure Name: MAC Date/Time: 07/07/2019 12:08 PM Performed by: Justice Rocher, CRNA Pre-anesthesia Checklist: Patient identified, Emergency Drugs available, Suction available, Patient being monitored and Timeout performed Patient Re-evaluated:Patient Re-evaluated prior to induction Oxygen Delivery Method: Simple face mask Preoxygenation: Pre-oxygenation with 100% oxygen Induction Type: IV induction Placement Confirmation: positive ETCO2

## 2019-07-07 NOTE — Op Note (Signed)
07/07/2019  12:26 PM  PATIENT:  Jeremy Mendoza  39 y.o. male  Patient Care Team: Lin Landsman, MD as PCP - General (Family Medicine) Michael Boston, MD as Consulting Physician (General Surgery)  PRE-OPERATIVE DIAGNOSIS:  chronic anal fissure  POST-OPERATIVE DIAGNOSIS:  chronic anal fissure  PROCEDURE:   ANAL EXAM UNDER ANESTHESIA CHEMICAL SPHINCTEROTOMY   Surgeon(s): Leighton Ruff, MD  ASSISTANT: none   ANESTHESIA:   local and MAC  SPECIMEN:  No Specimen  DISPOSITION OF SPECIMEN:  N/A  COUNTS:  YES  PLAN OF CARE: Discharge to home after PACU  PATIENT DISPOSITION:  PACU - hemodynamically stable.  INDICATION: 39 y.o. 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  MAC anesthesia was induced without difficulty. The patient was then positioned in prone jackknife position with buttocks gently taped apart.  The patient was then prepped and draped in usual sterile fashion.  SCDs were noted to be in place prior to the initiation of anesthesia. A surgical timeout was performed indicating the correct patient, procedure, positioning and need for preoperative antibiotics.  A rectal block was performed using Marcaine with epinephrine.    I began with a digital rectal exam. There was sphincter hypertension noted. There were no masses noted. The anal canal was gently dilated to 2 fingerbreadths. I then placed a Hill-Ferguson anoscope into the anal canal and evaluated this completely. There was a posterior midline anal fissure noted. There was no sign of hemorrhoid disease. I injected 100 units of Botox into the intersphincteric groove circumferentially. Hemostasis was good. I resected a small amount of scar tissue from the fissure to allow for better healing. The anoscope was removed. A dressing  was applied. The patient was awakened from anesthesia and sent to the postanesthesia care unit in stable condition. All counts were correct per operating room staff.

## 2019-07-07 NOTE — Anesthesia Preprocedure Evaluation (Addendum)
Anesthesia Evaluation  Patient identified by MRN, date of birth, ID band Patient awake    Reviewed: Allergy & Precautions, NPO status , Patient's Chart, lab work & pertinent test results  History of Anesthesia Complications Negative for: history of anesthetic complications  Airway Mallampati: II  TM Distance: >3 FB Neck ROM: Full    Dental  (+) Dental Advisory Given, Teeth Intact   Pulmonary Current Smoker and Patient abstained from smoking.,    Pulmonary exam normal        Cardiovascular hypertension (no meds), Normal cardiovascular exam     Neuro/Psych PSYCHIATRIC DISORDERS Anxiety negative neurological ROS     GI/Hepatic negative GI ROS, Neg liver ROS,   Endo/Other   Obesity   Renal/GU negative Renal ROS     Musculoskeletal negative musculoskeletal ROS (+)   Abdominal   Peds  Hematology negative hematology ROS (+)   Anesthesia Other Findings Covid+ 03/2019 - mild symptoms, resolved Covid neg 5/1  Reproductive/Obstetrics                            Anesthesia Physical Anesthesia Plan  ASA: II  Anesthesia Plan: MAC   Post-op Pain Management:    Induction: Intravenous  PONV Risk Score and Plan: 1 and Propofol infusion and Treatment may vary due to age or medical condition  Airway Management Planned: Nasal Cannula and Natural Airway  Additional Equipment: None  Intra-op Plan:   Post-operative Plan:   Informed Consent: I have reviewed the patients History and Physical, chart, labs and discussed the procedure including the risks, benefits and alternatives for the proposed anesthesia with the patient or authorized representative who has indicated his/her understanding and acceptance.       Plan Discussed with: CRNA and Anesthesiologist  Anesthesia Plan Comments:        Anesthesia Quick Evaluation

## 2019-07-07 NOTE — Discharge Instructions (Addendum)
Beginning the day after surgery: ° °You may sit in a tub of warm water 2-3 times a day to relieve discomfort. ° °Eat a regular diet high in fiber.  Avoid foods that give you constipation or diarrhea.  Avoid foods that are difficult to digest, such as seeds, nuts, corn or popcorn. ° °Do not go any longer than 2 days without a bowel movement.  You may take a dose of Milk of Magnesia if you become constipated.   ° °Drink 6-8 glasses of water daily. ° °Walking is encouraged.  Avoid strenuous activity and heavy lifting for one month after surgery.   ° °Call the office if you have any questions or concerns.  Call immediately if you develop: ° °· Excessive rectal bleeding (more than a cup or passing large clots) °· Increased discomfort °· Fever greater than 100 F °· Difficulty urinating ° °Post Anesthesia Home Care Instructions ° °Activity: °Get plenty of rest for the remainder of the day. A responsible adult should stay with you for 24 hours following the procedure.  °For the next 24 hours, DO NOT: °-Drive a car °-Operate machinery °-Drink alcoholic beverages °-Take any medication unless instructed by your physician °-Make any legal decisions or sign important papers. ° °Meals: °Start with liquid foods such as gelatin or soup. Progress to regular foods as tolerated. Avoid greasy, spicy, heavy foods. If nausea and/or vomiting occur, drink only clear liquids until the nausea and/or vomiting subsides. Call your physician if vomiting continues. ° °Special Instructions/Symptoms: °Your throat may feel dry or sore from the anesthesia or the breathing tube placed in your throat during surgery. If this causes discomfort, gargle with warm salt water. The discomfort should disappear within 24 hours. ° °If you had a scopolamine patch placed behind your ear for the management of post- operative nausea and/or vomiting: ° °1. The medication in the patch is effective for 72 hours, after which it should be removed.  Wrap patch in a tissue  and discard in the trash. Wash hands thoroughly with soap and water. °2. You may remove the patch earlier than 72 hours if you experience unpleasant side effects which may include dry mouth, dizziness or visual disturbances. °3. Avoid touching the patch. Wash your hands with soap and water after contact with the patch. °  ° °

## 2019-07-07 NOTE — Transfer of Care (Signed)
Immediate Anesthesia Transfer of Care Note  Patient: Jeremy Mendoza  Procedure(s) Performed: Procedure(s) (LRB): ANAL EXAM UNDER ANESTHESIA (N/A) CHEMICAL SPHINCTEROTOMY (N/A)  Patient Location: PACU  Anesthesia Type: General  Level of Consciousness: awake, sedated, patient cooperative and responds to stimulation  Airway & Oxygen Therapy: Patient Spontanous Breathing and Patient on RA with soft FM   Post-op Assessment: Report given to PACU RN, Post -op Vital signs reviewed and stable and Patient moving all extremities  Post vital signs: Reviewed and stable  Complications: No apparent anesthesia complications

## 2019-07-07 NOTE — H&P (Signed)
The patient is a 39 year old male who presents with anal pain. 39 year old male who presents to the office for evaluation of anal pain with bowel movements. He states he has 3-4 year history with rectal pain with constipation. He has been seen in the office before for rectal bleeding and underwent rubber band ligation in 2017 with Dr. Johney Maine. He reports occasional problems with anal fissures in the past, which have responded to medical treatment. He denies much rectal bleeding currently. He has been using nitroglycerin ointment for the past several months with no change in his symptoms.   Problem List/Past Medical Mammie Lorenzo, LPN; 579FGE 624THL PM) INTERNAL BLEEDING HEMORRHOIDS (K64.8)  PROLAPSED INTERNAL HEMORRHOIDS, GRADE 2 (K64.1)  ANAL FISSURE (K60.2)   Past Surgical History Mammie Lorenzo, LPN; 579FGE 624THL PM) No pertinent past surgical history   Diagnostic Studies History Mammie Lorenzo, LPN; 579FGE 624THL PM) Colonoscopy  within last year  Allergies Leighton Ruff, MD; 123456 3:39 PM) No Known Drug Allergies  [09/06/2015]:  Medication History (Armen Ferguson, CMA; 06/07/2019 3:20 PM) Lisinopril (10MG  Tablet, Oral) Active. Escitalopram Oxalate (10MG  Tablet, Oral) Active. Medications Reconciled  Social History Mammie Lorenzo, LPN; 579FGE 624THL PM) No alcohol use  No caffeine use  No drug use  Tobacco use  Never smoker.  Family History Mammie Lorenzo, LPN; 579FGE 624THL PM) Breast Cancer  Sister. Hypertension  Mother. Prostate Cancer  Father.  Other Problems Mammie Lorenzo, LPN; 579FGE 624THL PM) Back Pain  Gastroesophageal Reflux Disease     Review of Systems Mammie Lorenzo LPN; 579FGE 624THL PM) General Present- Appetite Loss. Not Present- Chills, Fatigue, Fever, Night Sweats, Weight Gain and Weight Loss. Skin Not Present- Change in Wart/Mole, Dryness, Hives, Jaundice, New Lesions, Non-Healing Wounds, Rash and Ulcer. HEENT Not Present-  Earache, Hearing Loss, Hoarseness, Nose Bleed, Oral Ulcers, Ringing in the Ears, Seasonal Allergies, Sinus Pain, Sore Throat, Visual Disturbances, Wears glasses/contact lenses and Yellow Eyes. Respiratory Not Present- Bloody sputum, Chronic Cough, Difficulty Breathing, Snoring and Wheezing. Breast Not Present- Breast Mass, Breast Pain, Nipple Discharge and Skin Changes. Cardiovascular Not Present- Chest Pain, Difficulty Breathing Lying Down, Leg Cramps, Palpitations, Rapid Heart Rate, Shortness of Breath and Swelling of Extremities. Gastrointestinal Present- Bloody Stool, Change in Bowel Habits, Constipation, Hemorrhoids and Rectal Pain. Not Present- Abdominal Pain, Bloating, Chronic diarrhea, Difficulty Swallowing, Excessive gas, Gets full quickly at meals, Indigestion, Nausea and Vomiting. Male Genitourinary Not Present- Blood in Urine, Change in Urinary Stream, Frequency, Impotence, Nocturia, Painful Urination, Urgency and Urine Leakage. Musculoskeletal Present- Back Pain. Not Present- Joint Pain, Joint Stiffness, Muscle Pain, Muscle Weakness and Swelling of Extremities. Neurological Present- Weakness. Not Present- Decreased Memory, Fainting, Headaches, Numbness, Seizures, Tingling, Tremor and Trouble walking. Psychiatric Not Present- Anxiety, Bipolar, Change in Sleep Pattern, Depression, Fearful and Frequent crying. Endocrine Not Present- Cold Intolerance, Excessive Hunger, Hair Changes, Heat Intolerance, Hot flashes and New Diabetes. Hematology Not Present- Blood Thinners, Easy Bruising, Excessive bleeding, Gland problems, HIV and Persistent Infections.  Vitals (Armen Ferguson CMA; 06/07/2019 3:19 PM) 06/07/2019 3:19 PM Weight: 216 lb Height: 69in Body Surface Area: 2.13 m Body Mass Index: 31.9 kg/m  Temp.: 98.59F  Pulse: 83 (Regular)  P.OX: 99% (Room air) BP: 118/76(Sitting, Left Arm, Standard)       Physical Exam Leighton Ruff MD; 123456 3:36 PM) General Mental  Status-Alert. General Appearance-Cooperative.  Abdomen Palpation/Percussion Palpation and Percussion of the abdomen reveal - Soft and Non Tender.  Rectal Anorectal Exam External - anal fissure. Internal - increased sphincter tone.  Assessment & Plan Leighton Ruff MD; 123456 3:38 PM) ANAL FISSURE (K60.2) Impression: 39 year old male who presents to the office with complaints of chronic anal pain. On exam today, he has an internal anal fissure. He has tried nitroglycerin ointment for the past several months with no resolution of his symptoms. We discussed performing a chemical sphincterotomy versus lateral internal sphincterotomy. We discussed the risk and benefits of both. He would like to proceed with chemical sphincterotomy.  Risks include pain, bleeding, recurrence and temporary incontinence.

## 2019-07-08 NOTE — Anesthesia Postprocedure Evaluation (Signed)
Anesthesia Post Note  Patient: Jeremy Mendoza  Procedure(s) Performed: ANAL EXAM UNDER ANESTHESIA (N/A Rectum) CHEMICAL SPHINCTEROTOMY (N/A Rectum)     Patient location during evaluation: PACU Anesthesia Type: MAC Level of consciousness: awake and alert Pain management: pain level controlled Vital Signs Assessment: post-procedure vital signs reviewed and stable Respiratory status: spontaneous breathing, nonlabored ventilation and respiratory function stable Cardiovascular status: stable and blood pressure returned to baseline Anesthetic complications: no    Last Vitals:  Vitals:   07/07/19 1315 07/07/19 1415  BP: 119/75 128/80  Pulse: 62 (!) 56  Resp: 18 18  Temp:  36.4 C  SpO2: 95% 100%    Last Pain:  Vitals:   07/08/19 0916  TempSrc:   PainSc: 1    Pain Goal: Patients Stated Pain Goal: 6 (07/07/19 0956)                 Audry Pili

## 2019-08-07 ENCOUNTER — Other Ambulatory Visit (HOSPITAL_COMMUNITY): Payer: No Typology Code available for payment source

## 2019-09-27 ENCOUNTER — Ambulatory Visit: Payer: Self-pay | Admitting: General Surgery

## 2019-09-27 NOTE — H&P (Signed)
The patient is a 39 year old male who presents with anal pain. 39 year old male who presented to the office for evaluation of anal pain with bowel movements. He states he has 3-4 year history with rectal pain with constipation. He has been seen in the office before for rectal bleeding and underwent rubber band ligation in 2017 with Dr. Johney Maine. He reports occasional problems with anal fissures in the past, which have responded to medical treatment. He denies much rectal bleeding currently. He has been using nitroglycerin ointment for the past several months with no change in his symptoms. He was taken into the operating room in early May 2021. An anal fissure was identified. A chemical sphincterotomy was performed. Afterwards, he noticed a decrease in his rectal bleeding as well as a decrease in his anal pain. He reports soft bowel movements. He then states over the last few weeks he has noticed worsening pain in his perirectal area. He is here for repeat evaluation.   Problem List/Past Medical Leighton Ruff, MD; 3/47/4259 4:20 PM) INTERNAL BLEEDING HEMORRHOIDS (K64.8) PROLAPSED INTERNAL HEMORRHOIDS, GRADE 2 (K64.1) ANAL FISSURE (K60.2)  Past Surgical History Leighton Ruff, MD; 5/63/8756 4:20 PM) No pertinent past surgical history  Diagnostic Studies History Leighton Ruff, MD; 4/33/2951 4:20 PM) Colonoscopy within last year  Allergies Emeline Gins, [place]; 09/27/2019 3:47 PM) No Known Drug Allergies [09/06/2015]: Allergies Reconciled  Medication History Emeline Gins, [place]; 09/27/2019 3:47 PM) No Current Medications Medications Reconciled  Social History Leighton Ruff, MD; 8/84/1660 4:20 PM) No alcohol use No caffeine use No drug use Tobacco use Never smoker.  Family History Leighton Ruff, MD; 09/01/1599 4:20 PM) Breast Cancer Sister. Hypertension Mother. Prostate Cancer Father.  Other Problems Leighton Ruff, MD; 0/93/2355 4:20 PM) Back  Pain Gastroesophageal Reflux Disease     Review of Systems Leighton Ruff MD; 7/32/2025 4:20 PM) General Present- Appetite Loss. Not Present- Chills, Fatigue, Fever, Night Sweats, Weight Gain and Weight Loss. Skin Not Present- Change in Wart/Mole, Dryness, Hives, Jaundice, New Lesions, Non-Healing Wounds, Rash and Ulcer. HEENT Not Present- Earache, Hearing Loss, Hoarseness, Nose Bleed, Oral Ulcers, Ringing in the Ears, Seasonal Allergies, Sinus Pain, Sore Throat, Visual Disturbances, Wears glasses/contact lenses and Yellow Eyes. Respiratory Not Present- Bloody sputum, Chronic Cough, Difficulty Breathing, Snoring and Wheezing. Breast Not Present- Breast Mass, Breast Pain, Nipple Discharge and Skin Changes. Cardiovascular Not Present- Chest Pain, Difficulty Breathing Lying Down, Leg Cramps, Palpitations, Rapid Heart Rate, Shortness of Breath and Swelling of Extremities. Gastrointestinal Present- Bloody Stool, Change in Bowel Habits, Constipation, Hemorrhoids and Rectal Pain. Not Present- Abdominal Pain, Bloating, Chronic diarrhea, Difficulty Swallowing, Excessive gas, Gets full quickly at meals, Indigestion, Nausea and Vomiting. Male Genitourinary Not Present- Blood in Urine, Change in Urinary Stream, Frequency, Impotence, Nocturia, Painful Urination, Urgency and Urine Leakage. Musculoskeletal Present- Back Pain. Not Present- Joint Pain, Joint Stiffness, Muscle Pain, Muscle Weakness and Swelling of Extremities. Neurological Not Present- Decreased Memory, Fainting, Headaches and Numbness.  Vitals Emeline Gins CMA; 09/27/2019 3:47 PM) 09/27/2019 3:46 PM Weight: 216.8 lb Height: 69in Body Surface Area: 2.14 m Body Mass Index: 32.02 kg/m  Temp.: 97.51F  Pulse: 75 (Regular)  BP: 128/84(Sitting, Left Arm, Standard)        Physical Exam Leighton Ruff MD; 06/28/621 4:21 PM)  General Mental Status-Alert. General Appearance-Cooperative.  Rectal Note: Small  inflammatory nodule noted at posterior midline. Small posterior fissure noted. Sphincter hypertension noted.    Assessment & Plan Leighton Ruff MD; 7/62/8315 4:24 PM)  ANAL FISSURE (K60.2) Impression: Patient appears  to have a recurrent anal fissure despite chemical sphincterotomy. We have discussed that Botox will likely not work. If tried again. Patient would like to proceed with lateral internal sphincterotomy. We will also try to investigate his inflammatory nodule and remove this during his surgery. We have discussed the alternatives to this treatment, which would be repeat course of nitroglycerin or diltiazem ointment. We have discussed the risk of this procedure, which are mainly permanent fecal incontinence (approximately 20%), and recurrence (less than 5%).

## 2019-09-27 NOTE — H&P (View-Only) (Signed)
The patient is a 39 year old male who presents with anal pain. 39 year old male who presented to the office for evaluation of anal pain with bowel movements. He states he has 3-4 year history with rectal pain with constipation. He has been seen in the office before for rectal bleeding and underwent rubber band ligation in 2017 with Dr. Johney Maine. He reports occasional problems with anal fissures in the past, which have responded to medical treatment. He denies much rectal bleeding currently. He has been using nitroglycerin ointment for the past several months with no change in his symptoms. He was taken into the operating room in early May 2021. An anal fissure was identified. A chemical sphincterotomy was performed. Afterwards, he noticed a decrease in his rectal bleeding as well as a decrease in his anal pain. He reports soft bowel movements. He then states over the last few weeks he has noticed worsening pain in his perirectal area. He is here for repeat evaluation.   Problem List/Past Medical Leighton Ruff, MD; 1/47/8295 4:20 PM) INTERNAL BLEEDING HEMORRHOIDS (K64.8) PROLAPSED INTERNAL HEMORRHOIDS, GRADE 2 (K64.1) ANAL FISSURE (K60.2)  Past Surgical History Leighton Ruff, MD; 08/22/3084 4:20 PM) No pertinent past surgical history  Diagnostic Studies History Leighton Ruff, MD; 5/78/4696 4:20 PM) Colonoscopy within last year  Allergies Emeline Gins, [place]; 09/27/2019 3:47 PM) No Known Drug Allergies [09/06/2015]: Allergies Reconciled  Medication History Emeline Gins, [place]; 09/27/2019 3:47 PM) No Current Medications Medications Reconciled  Social History Leighton Ruff, MD; 2/95/2841 4:20 PM) No alcohol use No caffeine use No drug use Tobacco use Never smoker.  Family History Leighton Ruff, MD; 05/25/4008 4:20 PM) Breast Cancer Sister. Hypertension Mother. Prostate Cancer Father.  Other Problems Leighton Ruff, MD; 2/72/5366 4:20 PM) Back  Pain Gastroesophageal Reflux Disease     Review of Systems Leighton Ruff MD; 4/40/3474 4:20 PM) General Present- Appetite Loss. Not Present- Chills, Fatigue, Fever, Night Sweats, Weight Gain and Weight Loss. Skin Not Present- Change in Wart/Mole, Dryness, Hives, Jaundice, New Lesions, Non-Healing Wounds, Rash and Ulcer. HEENT Not Present- Earache, Hearing Loss, Hoarseness, Nose Bleed, Oral Ulcers, Ringing in the Ears, Seasonal Allergies, Sinus Pain, Sore Throat, Visual Disturbances, Wears glasses/contact lenses and Yellow Eyes. Respiratory Not Present- Bloody sputum, Chronic Cough, Difficulty Breathing, Snoring and Wheezing. Breast Not Present- Breast Mass, Breast Pain, Nipple Discharge and Skin Changes. Cardiovascular Not Present- Chest Pain, Difficulty Breathing Lying Down, Leg Cramps, Palpitations, Rapid Heart Rate, Shortness of Breath and Swelling of Extremities. Gastrointestinal Present- Bloody Stool, Change in Bowel Habits, Constipation, Hemorrhoids and Rectal Pain. Not Present- Abdominal Pain, Bloating, Chronic diarrhea, Difficulty Swallowing, Excessive gas, Gets full quickly at meals, Indigestion, Nausea and Vomiting. Male Genitourinary Not Present- Blood in Urine, Change in Urinary Stream, Frequency, Impotence, Nocturia, Painful Urination, Urgency and Urine Leakage. Musculoskeletal Present- Back Pain. Not Present- Joint Pain, Joint Stiffness, Muscle Pain, Muscle Weakness and Swelling of Extremities. Neurological Not Present- Decreased Memory, Fainting, Headaches and Numbness.  Vitals Emeline Gins CMA; 09/27/2019 3:47 PM) 09/27/2019 3:46 PM Weight: 216.8 lb Height: 69in Body Surface Area: 2.14 m Body Mass Index: 32.02 kg/m  Temp.: 97.40F  Pulse: 75 (Regular)  BP: 128/84(Sitting, Left Arm, Standard)        Physical Exam Leighton Ruff MD; 2/59/5638 4:21 PM)  General Mental Status-Alert. General Appearance-Cooperative.  Rectal Note: Small  inflammatory nodule noted at posterior midline. Small posterior fissure noted. Sphincter hypertension noted.    Assessment & Plan Leighton Ruff MD; 7/56/4332 4:24 PM)  ANAL FISSURE (K60.2) Impression: Patient appears  to have a recurrent anal fissure despite chemical sphincterotomy. We have discussed that Botox will likely not work. If tried again. Patient would like to proceed with lateral internal sphincterotomy. We will also try to investigate his inflammatory nodule and remove this during his surgery. We have discussed the alternatives to this treatment, which would be repeat course of nitroglycerin or diltiazem ointment. We have discussed the risk of this procedure, which are mainly permanent fecal incontinence (approximately 20%), and recurrence (less than 5%).

## 2019-10-06 ENCOUNTER — Other Ambulatory Visit: Payer: Self-pay

## 2019-10-06 ENCOUNTER — Encounter (HOSPITAL_BASED_OUTPATIENT_CLINIC_OR_DEPARTMENT_OTHER): Payer: Self-pay | Admitting: General Surgery

## 2019-10-06 NOTE — Progress Notes (Signed)
Spoke w/ via phone for pre-op interview---PT Lab needs dos----    I stat 8   Lab results------ekg 07-07-2019 COVID test ------10-12-2019 1300  Arrive at -------530 am 10-15-219 NPO after MN NO Solid Food.  Clear liquids from MN until---430 am then npo Medications to take morning of surgery -----none Diabetic medication -----n/a Patient Special Instructions -----none Pre-Op special Istructions -----none Patient verbalized understanding of instructions that were given at this phone interview. Patient denies shortness of breath, chest pain, fever, cough a this phone interview.

## 2019-10-12 ENCOUNTER — Other Ambulatory Visit (HOSPITAL_COMMUNITY)
Admission: RE | Admit: 2019-10-12 | Discharge: 2019-10-12 | Disposition: A | Discharge status: Home / Self Care | Payer: No Typology Code available for payment source | Source: Ambulatory Visit | Attending: General Surgery | Admitting: General Surgery

## 2019-10-12 DIAGNOSIS — Z01812 Encounter for preprocedural laboratory examination: Secondary | ICD-10-CM | POA: Diagnosis present

## 2019-10-12 DIAGNOSIS — Z20822 Contact with and (suspected) exposure to covid-19: Secondary | ICD-10-CM | POA: Diagnosis not present

## 2019-10-12 LAB — SARS CORONAVIRUS 2 (TAT 6-24 HRS): SARS Coronavirus 2: NEGATIVE

## 2019-10-15 ENCOUNTER — Encounter (HOSPITAL_BASED_OUTPATIENT_CLINIC_OR_DEPARTMENT_OTHER)
Admission: RE | Disposition: A | Discharge status: Home / Self Care | Payer: Self-pay | Source: Home / Self Care | Attending: General Surgery

## 2019-10-15 ENCOUNTER — Other Ambulatory Visit: Payer: Self-pay

## 2019-10-15 ENCOUNTER — Ambulatory Visit (HOSPITAL_BASED_OUTPATIENT_CLINIC_OR_DEPARTMENT_OTHER): Payer: No Typology Code available for payment source | Admitting: Certified Registered"

## 2019-10-15 ENCOUNTER — Encounter (HOSPITAL_BASED_OUTPATIENT_CLINIC_OR_DEPARTMENT_OTHER): Payer: Self-pay | Admitting: General Surgery

## 2019-10-15 ENCOUNTER — Ambulatory Visit (HOSPITAL_BASED_OUTPATIENT_CLINIC_OR_DEPARTMENT_OTHER)
Admission: RE | Admit: 2019-10-15 | Discharge: 2019-10-15 | Disposition: A | Discharge status: Home / Self Care | Payer: No Typology Code available for payment source | Attending: General Surgery | Admitting: General Surgery

## 2019-10-15 DIAGNOSIS — K602 Anal fissure, unspecified: Secondary | ICD-10-CM | POA: Diagnosis not present

## 2019-10-15 HISTORY — PX: RECTAL EXAM UNDER ANESTHESIA: SHX6399

## 2019-10-15 HISTORY — PX: SPHINCTEROTOMY: SHX5279

## 2019-10-15 LAB — POCT I-STAT, CHEM 8
BUN: 22 mg/dL — ABNORMAL HIGH (ref 6–20)
Calcium, Ion: 1.26 mmol/L (ref 1.15–1.40)
Chloride: 104 mmol/L (ref 98–111)
Creatinine, Ser: 1.1 mg/dL (ref 0.61–1.24)
Glucose, Bld: 98 mg/dL (ref 70–99)
HCT: 43 % (ref 39.0–52.0)
Hemoglobin: 14.6 g/dL (ref 13.0–17.0)
Potassium: 3.6 mmol/L (ref 3.5–5.1)
Sodium: 141 mmol/L (ref 135–145)
TCO2: 21 mmol/L — ABNORMAL LOW (ref 22–32)

## 2019-10-15 SURGERY — EXAM UNDER ANESTHESIA, RECTUM
Anesthesia: Monitor Anesthesia Care | Site: Rectum

## 2019-10-15 MED ORDER — MIDAZOLAM HCL 2 MG/2ML IJ SOLN
INTRAMUSCULAR | Status: AC
  Filled 2019-10-15: qty 2

## 2019-10-15 MED ORDER — MIDAZOLAM HCL 5 MG/5ML IJ SOLN
INTRAMUSCULAR | Status: DC | PRN
  Administered 2019-10-15: 2 mg via INTRAVENOUS

## 2019-10-15 MED ORDER — OXYCODONE HCL 5 MG PO TABS: 5.0000 mg | ORAL_TABLET | Freq: Once | ORAL | Status: DC | PRN

## 2019-10-15 MED ORDER — PROPOFOL 500 MG/50ML IV EMUL
INTRAVENOUS | Status: AC
  Filled 2019-10-15: qty 50

## 2019-10-15 MED ORDER — CELECOXIB 200 MG PO CAPS
200.0000 mg | ORAL_CAPSULE | ORAL | Status: AC
  Administered 2019-10-15: 200 mg via ORAL

## 2019-10-15 MED ORDER — ACETAMINOPHEN 325 MG PO TABS: 650.0000 mg | ORAL_TABLET | ORAL | Status: DC | PRN

## 2019-10-15 MED ORDER — DEXMEDETOMIDINE (PRECEDEX) IN NS 20 MCG/5ML (4 MCG/ML) IV SYRINGE
PREFILLED_SYRINGE | INTRAVENOUS | Status: AC
  Filled 2019-10-15: qty 5

## 2019-10-15 MED ORDER — GABAPENTIN 300 MG PO CAPS
ORAL_CAPSULE | ORAL | Status: AC
  Filled 2019-10-15: qty 1

## 2019-10-15 MED ORDER — ACETAMINOPHEN 500 MG PO TABS
1000.0000 mg | ORAL_TABLET | ORAL | Status: AC
  Administered 2019-10-15: 1000 mg via ORAL

## 2019-10-15 MED ORDER — FENTANYL CITRATE (PF) 100 MCG/2ML IJ SOLN
INTRAMUSCULAR | Status: AC
  Filled 2019-10-15: qty 2

## 2019-10-15 MED ORDER — DEXMEDETOMIDINE (PRECEDEX) IN NS 20 MCG/5ML (4 MCG/ML) IV SYRINGE
PREFILLED_SYRINGE | INTRAVENOUS | Status: DC | PRN
  Administered 2019-10-15: 8 ug via INTRAVENOUS
  Administered 2019-10-15: 4 ug via INTRAVENOUS

## 2019-10-15 MED ORDER — SODIUM CHLORIDE 0.9% FLUSH: 3.0000 mL | Freq: Two times a day (BID) | INTRAVENOUS | Status: DC

## 2019-10-15 MED ORDER — CELECOXIB 200 MG PO CAPS
ORAL_CAPSULE | ORAL | Status: AC
  Filled 2019-10-15: qty 1

## 2019-10-15 MED ORDER — ACETAMINOPHEN 325 MG RE SUPP: 650.0000 mg | RECTAL | Status: DC | PRN

## 2019-10-15 MED ORDER — OXYCODONE HCL 5 MG/5ML PO SOLN: 5.0000 mg | Freq: Once | ORAL | Status: DC | PRN

## 2019-10-15 MED ORDER — HYDROCODONE-ACETAMINOPHEN 5-325 MG PO TABS: 1.0000 | ORAL_TABLET | ORAL | 0 refills | Status: DC | PRN

## 2019-10-15 MED ORDER — LIDOCAINE 5 % EX OINT
TOPICAL_OINTMENT | CUTANEOUS | Status: DC | PRN
  Administered 2019-10-15: 1

## 2019-10-15 MED ORDER — GABAPENTIN 300 MG PO CAPS
300.0000 mg | ORAL_CAPSULE | ORAL | Status: AC
  Administered 2019-10-15: 300 mg via ORAL

## 2019-10-15 MED ORDER — SODIUM CHLORIDE 0.9 % IV SOLN: 250.0000 mL | INTRAVENOUS | Status: DC | PRN

## 2019-10-15 MED ORDER — LIDOCAINE 2% (20 MG/ML) 5 ML SYRINGE
INTRAMUSCULAR | Status: DC | PRN
  Administered 2019-10-15: 60 mg via INTRAVENOUS
  Administered 2019-10-15: 40 mg via INTRAVENOUS

## 2019-10-15 MED ORDER — FENTANYL CITRATE (PF) 100 MCG/2ML IJ SOLN
INTRAMUSCULAR | Status: DC | PRN
  Administered 2019-10-15 (×2): 50 ug via INTRAVENOUS

## 2019-10-15 MED ORDER — PROMETHAZINE HCL 25 MG/ML IJ SOLN: 6.2500 mg | INTRAMUSCULAR | Status: DC | PRN

## 2019-10-15 MED ORDER — OXYCODONE HCL 5 MG PO TABS: 5.0000 mg | ORAL_TABLET | ORAL | Status: DC | PRN

## 2019-10-15 MED ORDER — PROPOFOL 500 MG/50ML IV EMUL
INTRAVENOUS | Status: DC | PRN
  Administered 2019-10-15: 75 ug/kg/min via INTRAVENOUS

## 2019-10-15 MED ORDER — BUPIVACAINE-EPINEPHRINE 0.5% -1:200000 IJ SOLN
INTRAMUSCULAR | Status: DC | PRN
  Administered 2019-10-15: 40 mL

## 2019-10-15 MED ORDER — FENTANYL CITRATE (PF) 100 MCG/2ML IJ SOLN: 25.0000 ug | INTRAMUSCULAR | Status: DC | PRN

## 2019-10-15 MED ORDER — SODIUM CHLORIDE 0.9% FLUSH: 3.0000 mL | INTRAVENOUS | Status: DC | PRN

## 2019-10-15 MED ORDER — LACTATED RINGERS IV SOLN: INTRAVENOUS | Status: DC

## 2019-10-15 MED ORDER — ACETAMINOPHEN 500 MG PO TABS
ORAL_TABLET | ORAL | Status: AC
  Filled 2019-10-15: qty 2

## 2019-10-15 SURGICAL SUPPLY — 59 items
BENZOIN TINCTURE PRP APPL 2/3 (GAUZE/BANDAGES/DRESSINGS) ×8 IMPLANT
BLADE EXTENDED COATED 6.5IN (ELECTRODE) IMPLANT
BLADE HEX COATED 2.75 (ELECTRODE) ×4 IMPLANT
BLADE SURG 10 STRL SS (BLADE) IMPLANT
BLADE SURG 15 STRL LF DISP TIS (BLADE) ×2 IMPLANT
BLADE SURG 15 STRL SS (BLADE) ×2
BRIEF STRETCH FOR OB PAD LRG (UNDERPADS AND DIAPERS) ×4 IMPLANT
CANISTER SUCT 3000ML PPV (MISCELLANEOUS) ×4 IMPLANT
CLOSURE WOUND 1/4X4 (GAUZE/BANDAGES/DRESSINGS) ×1
COVER BACK TABLE 60X90IN (DRAPES) ×4 IMPLANT
COVER MAYO STAND STRL (DRAPES) ×4 IMPLANT
COVER WAND RF STERILE (DRAPES) ×4 IMPLANT
DECANTER SPIKE VIAL GLASS SM (MISCELLANEOUS) ×4 IMPLANT
DRAPE LAPAROTOMY 100X72 PEDS (DRAPES) ×4 IMPLANT
DRAPE UTILITY XL STRL (DRAPES) ×4 IMPLANT
DRSG PAD ABDOMINAL 8X10 ST (GAUZE/BANDAGES/DRESSINGS) ×4 IMPLANT
ELECT REM PT RETURN 9FT ADLT (ELECTROSURGICAL) ×4
ELECTRODE REM PT RTRN 9FT ADLT (ELECTROSURGICAL) ×2 IMPLANT
GAUZE SPONGE 4X4 12PLY STRL (GAUZE/BANDAGES/DRESSINGS) ×4 IMPLANT
GLOVE BIO SURGEON STRL SZ 6.5 (GLOVE) ×3 IMPLANT
GLOVE BIO SURGEONS STRL SZ 6.5 (GLOVE) ×1
GLOVE BIOGEL PI IND STRL 7.0 (GLOVE) ×2 IMPLANT
GLOVE BIOGEL PI INDICATOR 7.0 (GLOVE) ×2
GOWN STRL REUS W/TWL XL LVL3 (GOWN DISPOSABLE) ×4 IMPLANT
HYDROGEN PEROXIDE 16OZ (MISCELLANEOUS) ×4 IMPLANT
IV CATH 14GX2 1/4 (CATHETERS) ×4 IMPLANT
IV CATH 18G SAFETY (IV SOLUTION) ×4 IMPLANT
KIT SIGMOIDOSCOPE (SET/KITS/TRAYS/PACK) IMPLANT
KIT TURNOVER CYSTO (KITS) ×4 IMPLANT
LOOP VESSEL MAXI BLUE (MISCELLANEOUS) IMPLANT
NDL SAFETY ECLIPSE 18X1.5 (NEEDLE) IMPLANT
NEEDLE HYPO 18GX1.5 SHARP (NEEDLE)
NEEDLE HYPO 22GX1.5 SAFETY (NEEDLE) ×4 IMPLANT
NS IRRIG 500ML POUR BTL (IV SOLUTION) ×4 IMPLANT
PACK BASIN DAY SURGERY FS (CUSTOM PROCEDURE TRAY) ×4 IMPLANT
PAD ARMBOARD 7.5X6 YLW CONV (MISCELLANEOUS) IMPLANT
PENCIL SMOKE EVACUATOR (MISCELLANEOUS) ×4 IMPLANT
SPONGE HEMORRHOID 8X3CM (HEMOSTASIS) IMPLANT
SPONGE SURGIFOAM ABS GEL 12-7 (HEMOSTASIS) IMPLANT
STRIP CLOSURE SKIN 1/4X4 (GAUZE/BANDAGES/DRESSINGS) ×3 IMPLANT
SUCTION FRAZIER HANDLE 10FR (MISCELLANEOUS)
SUCTION TUBE FRAZIER 10FR DISP (MISCELLANEOUS) IMPLANT
SUT CHROMIC 2 0 SH (SUTURE) IMPLANT
SUT CHROMIC 3 0 SH 27 (SUTURE) ×4 IMPLANT
SUT ETHIBOND 0 (SUTURE) IMPLANT
SUT VIC AB 2-0 SH 27 (SUTURE)
SUT VIC AB 2-0 SH 27XBRD (SUTURE) IMPLANT
SUT VIC AB 3-0 SH 18 (SUTURE) IMPLANT
SUT VIC AB 3-0 SH 27 (SUTURE)
SUT VIC AB 3-0 SH 27XBRD (SUTURE) IMPLANT
SYR CONTROL 10ML LL (SYRINGE) ×4 IMPLANT
TOWEL OR 17X26 10 PK STRL BLUE (TOWEL DISPOSABLE) ×4 IMPLANT
TRAP FLUID SMOKE EVACUATOR (MISCELLANEOUS) ×4 IMPLANT
TRAY DSU PREP LF (CUSTOM PROCEDURE TRAY) ×4 IMPLANT
TUBE CONNECTING 12'X1/4 (SUCTIONS) ×1
TUBE CONNECTING 12X1/4 (SUCTIONS) ×3 IMPLANT
UNDERPAD 30X36 HEAVY ABSORB (UNDERPADS AND DIAPERS) ×4 IMPLANT
WATER STERILE IRR 500ML POUR (IV SOLUTION) ×4 IMPLANT
YANKAUER SUCT BULB TIP NO VENT (SUCTIONS) ×4 IMPLANT

## 2019-10-15 NOTE — Anesthesia Postprocedure Evaluation (Signed)
Anesthesia Post Note  Patient: Jeremy Mendoza  Procedure(s) Performed: ANAL EXAM UNDER ANESTHESIA (N/A Anus) LATERAL INTERNAL SPHINCTEROTOMY (N/A Rectum)     Patient location during evaluation: PACU Anesthesia Type: MAC Level of consciousness: awake and alert Pain management: pain level controlled Vital Signs Assessment: post-procedure vital signs reviewed and stable Respiratory status: spontaneous breathing, nonlabored ventilation, respiratory function stable and patient connected to nasal cannula oxygen Cardiovascular status: stable and blood pressure returned to baseline Postop Assessment: no apparent nausea or vomiting Anesthetic complications: no   No complications documented.  Last Vitals:  Vitals:   10/15/19 0900 10/15/19 0915  BP: 131/88 124/90  Pulse: 70 69  Resp: 12 12  Temp:    SpO2: 98% 97%    Last Pain:  Vitals:   10/15/19 0915  TempSrc:   PainSc: 0-No pain                 Adelena Desantiago S

## 2019-10-15 NOTE — Discharge Instructions (Addendum)
ANORECTAL SURGERY: POST OP INSTRUCTIONS 1. Take your usually prescribed home medications unless otherwise directed. 2. DIET: During the first few hours after surgery sip on some liquids until you are able to urinate.  It is normal to not urinate for several hours after this surgery.  If you feel uncomfortable, please contact the office for instructions.  After you are able to urinate,you may eat, if you feel like it.  Follow a light bland diet the first 24 hours after arrival home, such as soup, liquids, crackers, etc.  Be sure to include lots of fluids daily (6-8 glasses).  Avoid fast food or heavy meals, as your are more likely to get nauseated.  Eat a low fat diet the next few days after surgery.  Limit caffeine intake to 1-2 servings a day. 3. PAIN CONTROL: a. Pain is best controlled by a usual combination of several different methods TOGETHER: i. Muscle relaxation: Soak in a warm bath (or Sitz bath) three times a day and after bowel movements.  Continue to do this until all pain is resolved. ii. Over the counter pain medication iii. Prescription pain medication b. Most patients will experience some swelling and discomfort in the anus/rectal area and incisions.  Heat such as warm towels, sitz baths, warm baths, etc to help relax tight/sore spots and speed recovery.  Some people prefer to use ice, especially in the first couple days after surgery, as it may decrease the pain and swelling, or alternate between ice & heat.  Experiment to what works for you.  Swelling and bruising can take several weeks to resolve.  Pain can take even longer to completely resolve. c. It is helpful to take an over-the-counter pain medication regularly for the first few weeks.  Choose one of the following that works best for you: i. Naproxen (Aleve, etc)  Two 220mg tabs twice a day ii. Ibuprofen (Advil, etc) Three 200mg tabs four times a day (every meal & bedtime) d. A  prescription for pain medication (such as percocet,  oxycodone, hydrocodone, etc) should be given to you upon discharge.  Take your pain medication as prescribed.  i. If you are having problems/concerns with the prescription medicine (does not control pain, nausea, vomiting, rash, itching, etc), please call us [phone] to see if we need to switch you to a different pain medicine that will work better for you and/or control your side effect better. ii. If you need a refill on your pain medication, please contact your pharmacy.  They will contact our office to request authorization. Prescriptions will not be filled after 5 pm or on week-ends. 4. KEEP YOUR BOWELS REGULAR and AVOID CONSTIPATION a. The goal is one to two soft bowel movements a day.  You should at least have a bowel movement every other day. b. Avoid getting constipated.  Between the surgery and the pain medications, it is common to experience some constipation. This can be very painful after rectal surgery.  Increasing fluid intake and taking a fiber supplement (such as Metamucil, Citrucel, FiberCon, etc) 1-2 times a day regularly will usually help prevent this problem from occurring.  A stool softener like colace is also recommended.  This can be purchased over the counter at your pharmacy.  You can take it up to 3 times a day.  If you do not have a bowel movement after 24 hrs since your surgery, take one does of milk of magnesia.  If you still haven't had a bowel movement 8-12 hours after   that dose, take another dose.  If you don't have a bowel movement 48 hrs after surgery, purchase a Fleets enema from the drug store and administer gently per package instructions.  If you still are having trouble with your bowel movements after that, please call the office for further instructions. c. If you develop diarrhea or have many loose bowel movements, simplify your diet to bland foods & liquids for a few days.  Stop any stool softeners and decrease your fiber supplement.  Switching to mild  anti-diarrheal medications (Kayopectate, Pepto Bismol) can help.  If this worsens or does not improve, please call us.  5. Wound Care a. Remove your bandages before your first bowel movement or 8 hours after surgery.     b. Remove any wound packing material at this tim,e as well.  You do not need to repack the wound unless instructed otherwise.  Wear an absorbent pad or soft cotton gauze in your underwear to catch any drainage and help keep the area clean. You should change this every 2-3 hours while awake. c. Keep the area clean and dry.  Bathe / shower every day, especially after bowel movements.  Keep the area clean by showering / bathing over the incision / wound.   It is okay to soak an open wound to help wash it.  Wet wipes or showers / gentle washing after bowel movements is often less traumatic than regular toilet paper. d. You may have some styrofoam-like soft packing in the rectum which will come out with the first bowel movement.  e. You will often notice bleeding with bowel movements.  This should slow down by the end of the first week of surgery f. Expect some drainage.  This should slow down, too, by the end of the first week of surgery.  Wear an absorbent pad or soft cotton gauze in your underwear until the drainage stops. g. Do Not sit on a rubber or pillow ring.  This can make you symptoms worse.  You may sit on a soft pillow if needed.  6. ACTIVITIES as tolerated:   a. You may resume regular (light) daily activities beginning the next day--such as daily self-care, walking, climbing stairs--gradually increasing activities as tolerated.  If you can walk 30 minutes without difficulty, it is safe to try more intense activity such as jogging, treadmill, bicycling, low-impact aerobics, swimming, etc. b. Save the most intensive and strenuous activity for last such as sit-ups, heavy lifting, contact sports, etc  Refrain from any heavy lifting or straining until you are off narcotics for pain  control.   c. You may drive when you are no longer taking prescription pain medication, you can comfortably sit for long periods of time, and you can safely maneuver your car and apply brakes. d. You may have sexual intercourse when it is comfortable.  7. FOLLOW UP in our office a. Please call [facility] at [phone] to set up an appointment to see your surgeon in the office for a follow-up appointment approximately 3-4 weeks after your surgery. b. Make sure that you call for this appointment the day you arrive home to insure a convenient appointment time. 10. IF YOU HAVE DISABILITY OR FAMILY LEAVE FORMS, BRING THEM TO THE OFFICE FOR PROCESSING.  DO NOT GIVE THEM TO YOUR DOCTOR.     WHEN TO CALL US [phone]: 1. Poor pain control 2. Reactions / problems with new medications (rash/itching, nausea, etc)  3. Fever over 101.5 F (38.5 C) 4.   Inability to urinate 5. Nausea and/or vomiting 6. Worsening swelling or bruising 7. Continued bleeding from incision. 8. Increased pain, redness, or drainage from the incision  The clinic staff is available to answer your questions during regular business hours (8:30am-5pm).  Please don't hesitate to call and ask to speak to one of our nurses for clinical concerns.   A surgeon from [facility] is always on call at the hospitals   If you have a medical emergency, go to the nearest emergency room or call 911.    [facility] [address], [address], [place], [place]  [address] ? MAIN: [phone] ? TOLL FREE: [phone] ? FAX [phone] [email or web address]    Post Anesthesia Home Care Instructions  Activity: Get plenty of rest for the remainder of the day. A responsible individual must stay with you for 24 hours following the procedure.  For the next 24 hours, DO NOT: -Drive a car -Operate machinery -Drink alcoholic beverages -Take any medication unless instructed by your  physician -Make any legal decisions or sign important papers.  Meals: Start with liquid foods such as gelatin or soup. Progress to regular foods as tolerated. Avoid greasy, spicy, heavy foods. If nausea and/or vomiting occur, drink only clear liquids until the nausea and/or vomiting subsides. Call your physician if vomiting continues.  Special Instructions/Symptoms: Your throat may feel dry or sore from the anesthesia or the breathing tube placed in your throat during surgery. If this causes discomfort, gargle with warm salt water. The discomfort should disappear within 24 hours.   

## 2019-10-15 NOTE — Interval H&P Note (Signed)
History and Physical Interval Note:  10/15/2019 7:17 AM  Jeremy Mendoza  has presented today for surgery, with the diagnosis of anal fissure.  The various methods of treatment have been discussed with the patient and family. After consideration of risks, benefits and other options for treatment, the patient has consented to  Procedure(s): ANAL EXAM UNDER ANESTHESIA (N/A) LATERAL INERNAL SPHINCTEROTOMY (N/A) as a surgical intervention.  The patient's history has been reviewed, patient examined, no change in status, stable for surgery.  I have reviewed the patient's chart and labs.  Questions were answered to the patient's satisfaction.     Rosario Adie, MD  [facility]

## 2019-10-15 NOTE — Anesthesia Preprocedure Evaluation (Signed)
Anesthesia Evaluation  Patient identified by MRN, date of birth, ID band Patient awake    Reviewed: Allergy & Precautions, NPO status , Patient's Chart, lab work & pertinent test results  Airway Mallampati: II  TM Distance: >3 FB Neck ROM: Full    Dental no notable dental hx.    Pulmonary Current Smoker and Patient abstained from smoking.,    Pulmonary exam normal breath sounds clear to auscultation       Cardiovascular Normal cardiovascular exam Rhythm:Regular Rate:Normal     Neuro/Psych negative neurological ROS  negative psych ROS   GI/Hepatic negative GI ROS, Neg liver ROS,   Endo/Other  negative endocrine ROS  Renal/GU negative Renal ROS  negative genitourinary   Musculoskeletal negative musculoskeletal ROS (+)   Abdominal   Peds negative pediatric ROS (+)  Hematology negative hematology ROS (+)   Anesthesia Other Findings   Reproductive/Obstetrics negative OB ROS                             Anesthesia Physical Anesthesia Plan  ASA: II  Anesthesia Plan: MAC   Post-op Pain Management:    Induction: Intravenous  PONV Risk Score and Plan: 1 and Ondansetron, Dexamethasone and Treatment may vary due to age or medical condition  Airway Management Planned: Simple Face Mask  Additional Equipment:   Intra-op Plan:   Post-operative Plan:   Informed Consent: I have reviewed the patients History and Physical, chart, labs and discussed the procedure including the risks, benefits and alternatives for the proposed anesthesia with the patient or authorized representative who has indicated his/her understanding and acceptance.     Dental advisory given  Plan Discussed with: CRNA and Surgeon  Anesthesia Plan Comments:         Anesthesia Quick Evaluation

## 2019-10-15 NOTE — Op Note (Signed)
10/15/2019  8:01 AM  PATIENT:  Jeremy Mendoza  39 y.o. male  Patient Care Team: Lin Landsman, MD as PCP - General (Family Medicine) Michael Boston, MD as Consulting Physician (General Surgery)  PRE-OPERATIVE DIAGNOSIS:  Recurrent anal fissure  POST-OPERATIVE DIAGNOSIS:  Recurrent anal fissure  PROCEDURE:  ANAL EXAM UNDER ANESTHESIA LATERAL INTERNAL SPHINCTEROTOMY AND FISSURECTOMY    Surgeon(s): Leighton Ruff, MD  ASSISTANT: none   ANESTHESIA:   local and MAC  SPECIMEN:  No Specimen  DISPOSITION OF SPECIMEN:  N/A  COUNTS:  YES  PLAN OF CARE: Discharge to home after PACU  PATIENT DISPOSITION:  PACU - hemodynamically stable.  INDICATION: recurrent anal fissure   Mendoza FINDINGS:   DESCRIPTION: the patient was identified in the preoperative holding area and taken to the Mendoza where they were laid on the operating room table.  MAC anesthesia was induced without difficulty. The patient was then positioned in prone jackknife position with buttocks gently taped apart.  The patient was then prepped and draped in usual sterile fashion.  SCDs were noted to be in place prior to the initiation of anesthesia. A surgical timeout was performed indicating the correct patient, procedure, positioning and need for preoperative antibiotics.  A rectal block was performed using Marcaine with epinephrine .    I began with a digital rectal exam.  There was a small nodule noted at posterior midline.  There were no other masses noted.  There was sphincter hypertension.  I dilated this gently to 2 fingerbreadths.  I then placed a Hill-Ferguson anoscope into the anal canal and evaluated this completely.  The nodule in the posterior midline appeared to be scar tissue from his anal fissure.  Fissurectomy was performed using Metzenbaum scissors.  I then turned my attention to the patient's left lateral anal canal.  I identified the intersphincteric groove and made an incision over this.  Dissection was carried down  through the subcutaneous tissues using blunt dissection and electrocautery.  The internal sphincter was identified and incised using Metzenbaum scissors and open fashion.  Hemostasis was good.  I divided down to just below the level of the fissure.  Sphincter hypertension was relieved.  I then closed the anoderm using interrupted 3-0 chromic sutures.  Hemostasis was good.  Lidocaine ointment and sterile dressing was applied.  The patient was awakened from anesthesia and sent to the postanesthesia care unit in stable condition.  All counts were correct per operating room staff.

## 2019-10-15 NOTE — Transfer of Care (Signed)
Immediate Anesthesia Transfer of Care Note  Patient: Jeremy Mendoza  Procedure(s) Performed: ANAL EXAM UNDER ANESTHESIA (N/A Anus) LATERAL INTERNAL SPHINCTEROTOMY (N/A Rectum)  Patient Location: PACU  Anesthesia Type:MAC  Level of Consciousness: awake  Airway & Oxygen Therapy: Patient Spontanous Breathing and Patient connected to face mask oxygen  Post-op Assessment: Report given to RN and Post -op Vital signs reviewed and stable  Post vital signs: Reviewed and stable  Last Vitals:  Vitals Value Taken Time  BP 131/86 10/15/19 0810  Temp    Pulse 80 10/15/19 0811  Resp 16 10/15/19 0811  SpO2 100 % 10/15/19 0811  Vitals shown include unvalidated device data.  Last Pain:  Vitals:   10/15/19 0610  TempSrc: Oral  PainSc: 2       Patients Stated Pain Goal: 5 (04/59/97 7414)  Complications: No complications documented.

## 2019-10-18 ENCOUNTER — Encounter (HOSPITAL_BASED_OUTPATIENT_CLINIC_OR_DEPARTMENT_OTHER): Payer: Self-pay | Admitting: General Surgery

## 2019-10-19 ENCOUNTER — Other Ambulatory Visit: Payer: Self-pay

## 2019-12-04 ENCOUNTER — Emergency Department (HOSPITAL_COMMUNITY): Payer: No Typology Code available for payment source

## 2019-12-04 ENCOUNTER — Encounter (HOSPITAL_COMMUNITY): Payer: Self-pay

## 2019-12-04 ENCOUNTER — Other Ambulatory Visit: Payer: Self-pay

## 2019-12-04 ENCOUNTER — Emergency Department (HOSPITAL_COMMUNITY)
Admission: EM | Admit: 2019-12-04 | Discharge: 2019-12-04 | Disposition: A | Discharge status: Home / Self Care | Payer: No Typology Code available for payment source | Attending: Emergency Medicine | Admitting: Emergency Medicine

## 2019-12-04 DIAGNOSIS — R102 Pelvic and perineal pain: Secondary | ICD-10-CM | POA: Insufficient documentation

## 2019-12-04 DIAGNOSIS — R300 Dysuria: Secondary | ICD-10-CM | POA: Diagnosis present

## 2019-12-04 DIAGNOSIS — F17210 Nicotine dependence, cigarettes, uncomplicated: Secondary | ICD-10-CM | POA: Insufficient documentation

## 2019-12-04 DIAGNOSIS — Z8616 Personal history of COVID-19: Secondary | ICD-10-CM | POA: Diagnosis not present

## 2019-12-04 DIAGNOSIS — A6002 Herpesviral infection of other male genital organs: Secondary | ICD-10-CM | POA: Insufficient documentation

## 2019-12-04 DIAGNOSIS — I10 Essential (primary) hypertension: Secondary | ICD-10-CM | POA: Insufficient documentation

## 2019-12-04 LAB — URINALYSIS, COMPLETE (UACMP) WITH MICROSCOPIC
Bacteria, UA: NONE SEEN
Bilirubin Urine: NEGATIVE
Glucose, UA: NEGATIVE mg/dL
Hgb urine dipstick: NEGATIVE
Ketones, ur: NEGATIVE mg/dL
Leukocytes,Ua: NEGATIVE
Nitrite: NEGATIVE
Protein, ur: NEGATIVE mg/dL
Specific Gravity, Urine: 1.023 (ref 1.005–1.030)
pH: 6 (ref 5.0–8.0)

## 2019-12-04 MED ORDER — VALACYCLOVIR HCL 500 MG PO TABS: 500.0000 mg | ORAL_TABLET | Freq: Two times a day (BID) | ORAL | 0 refills | Status: AC

## 2019-12-04 NOTE — ED Provider Notes (Signed)
[facility] DEPT Provider Note   CSN: [id number] Arrival date & time: 12/04/19  1103     History Chief Complaint  Patient presents with  . Difficulty urinating     Jeremy Mendoza is a 39 y.o. male who presents today for evaluation of dysuria.  He reports that for the past 45 to 50 days he has had dysuria.  He states that the penile pain is worse at the beginning of urination and towards the end.  He states that the pain does occasionally feel like it goes down into his testicles however it is not consistent.  He has been taking Flomax and Cipro.  He has been to urgent care twice since this started and was told he did not have a urinary tract infection.  He has been on the Cipro for about a week without significant change.  He denies any new rectal pain or new pain with bowel movements, he does have hemorrhoids and anal fissure however the symptoms are unchanged.  He denies fevers.  He that when his symptoms started originally he had penile discharge.  He states that he does have unprotected intercourse with his wife, however is not concerned for STD.    HPI     Past Medical History:  Diagnosis Date  . Anal fissure   . Anxiety   . COVID-19 03/19/2019   HEADACHE, COUGH, SYMPTOMS RESOLVED QUICKLY,  still has decreased sense of smell  . Hemorrhoids   . History of kidney stones   . Hypertension 03/2019   HAD LISINOPRIL 10 MG PRESCRIBED NEVER TOOK DUE TO BP RUIING OK    There are no problems to display for this patient.   Past Surgical History:  Procedure Laterality Date  . cololscopy  10/10/2018  . NO PAST SURGERIES    . RECTAL EXAM UNDER ANESTHESIA N/A 07/07/2019   Procedure: ANAL EXAM UNDER ANESTHESIA;  Surgeon: Leighton Ruff, MD;  Location: [facility];  Service: General;  Laterality: N/A;  . RECTAL EXAM UNDER ANESTHESIA N/A 10/15/2019   Procedure: ANAL EXAM UNDER ANESTHESIA;  Surgeon: Leighton Ruff, MD;  Location: [facility];  Service: General;  Laterality: N/A;  . SPHINCTEROTOMY N/A 07/07/2019   Procedure: CHEMICAL SPHINCTEROTOMY;  Surgeon: Leighton Ruff, MD;  Location: [facility];  Service: General;  Laterality: N/A;  . SPHINCTEROTOMY N/A 10/15/2019   Procedure: [place];  Surgeon: Leighton Ruff, MD;  Location: [facility];  Service: General;  Laterality: N/A;       Family History  Problem Relation Age of Onset  . Prostate cancer Father   . Lung cancer Father   . Liver cancer Paternal Aunt     Social History   Tobacco Use  . Smoking status: Current Some Day Smoker    Types: Cigarettes  . Smokeless tobacco: Never Used  . Tobacco comment: social  Vaping Use  . Vaping Use: Never used  Substance Use Topics  . Alcohol use: No  . Drug use: No    Home Medications Prior to Admission medications   Medication Sig Start Date End Date Taking? Authorizing Provider  ciprofloxacin (CIPRO) 250 MG tablet Take 250 mg by mouth 2 (two) times daily.   Yes [provider]  finasteride (PROSCAR) 5 MG tablet Take 5 mg by mouth daily.   Yes [provider]  HYDROcodone-acetaminophen (NORCO/VICODIN) 5-325 MG tablet Take 1-2 tablets by mouth every 4 (four) hours as needed. Patient not taking: Reported  on 12/04/2019 2/40/97   Leighton Ruff, MD  ibuprofen (ADVIL) 600 MG tablet Take 600 mg by mouth every 6 (six) hours as needed. Patient not taking: Reported on 12/04/2019    [provider]  traMADol (ULTRAM) 50 MG tablet Take 1-2 tablets (50-100 mg total) by mouth every 6 (six) hours as needed. Patient not taking: Reported on 12/04/2019 05/07/30   Leighton Ruff, MD  valACYclovir (VALTREX) 500 MG tablet Take 1 tablet (500 mg total) by mouth 2 (two) times daily for 3 days. 12/04/19 12/07/19  Lorin Glass, PA-C    Allergies    Patient has no known allergies.  Review of Systems   Review of Systems  Constitutional: Negative  for chills and fever.  HENT: Negative for congestion.   Eyes: Negative for visual disturbance.  Respiratory: Negative for shortness of breath.   Cardiovascular: Negative for chest pain.  Gastrointestinal: Negative for abdominal pain, diarrhea, nausea and vomiting.  Genitourinary: Positive for difficulty urinating, discharge, dysuria and penile pain. Negative for flank pain, frequency, penile swelling, scrotal swelling, testicular pain and urgency.  Musculoskeletal: Negative for back pain and neck pain.  Neurological: Negative for weakness and headaches.  All other systems reviewed and are negative.   Physical Exam Updated Vital Signs BP 137/89 (BP Location: Left Arm)   Pulse 62   Temp 98.1 F (36.7 C) (Oral)   Resp 16   SpO2 100%   Physical Exam Vitals and nursing note reviewed. Exam conducted with a chaperone present Rutherford Nail RN).  Constitutional:      General: He is not in acute distress.    Appearance: He is well-developed. He is not diaphoretic.  HENT:     Head: Normocephalic and atraumatic.  Eyes:     General: No scleral icterus.       Right eye: No discharge.        Left eye: No discharge.     Conjunctiva/sclera: Conjunctivae normal.  Cardiovascular:     Rate and Rhythm: Normal rate and regular rhythm.  Pulmonary:     Effort: Pulmonary effort is normal. No respiratory distress.     Breath sounds: No stridor.  Abdominal:     General: There is no distension.  Genitourinary:    Pubic Area: Rash present.     Penis: Circumcised. No discharge.      Testes: Normal.        Right: Tenderness or swelling not present.        Left: Tenderness or swelling not present.     Comments: Visualized debris in the urethral opening with out discharge.  To the left side of the base of the penis there is a 1-1.5cm diameter of clear vesicles.  No abnormal surrounding induration or redness. Musculoskeletal:        General: No deformity.     Cervical back: Normal range of motion.    Skin:    General: Skin is warm and dry.  Neurological:     Mental Status: He is alert.     Motor: No abnormal muscle tone.  Psychiatric:        Mood and Affect: Mood normal.        Behavior: Behavior normal.     ED Results / Procedures / Treatments   Labs (all labs ordered are listed, but only abnormal results are displayed) Labs Reviewed  URINE CULTURE  URINALYSIS, COMPLETE (UACMP) WITH MICROSCOPIC  GC/CHLAMYDIA PROBE AMP ([place]) NOT AT [facility]    EKG None  Radiology  CT Renal Stone Study  Result Date: 12/04/2019 CLINICAL DATA:  Pt presents with c/o difficulty urinating. Pt reports he is able to pass urine but that it is becoming more difficult to do say. EXAM: CT ABDOMEN AND PELVIS WITHOUT CONTRAST TECHNIQUE: Multidetector CT imaging of the abdomen and pelvis was performed following the standard protocol without IV contrast. COMPARISON:  None. FINDINGS: Lower chest: No acute abnormality. Evaluation of the abdominal viscera limited by the lack of IV contrast. Hepatobiliary: Fatty infiltration of the liver. No focal liver abnormality is seen. No gallstones, gallbladder wall thickening, or biliary dilatation. Pancreas: Unremarkable. Spleen: Normal in size without focal abnormality. Adrenals/Urinary Tract: Adrenal glands are unremarkable. Kidneys are symmetric in size. No renal or ureteral calculi. No hydronephrosis. Urinary bladder is unremarkable. Stomach/Bowel: Stomach is within normal limits. Appendix appears normal. No evidence of bowel wall thickening, distention, or inflammatory changes. Few colonic diverticula. Vascular/Lymphatic: No lymphadenopathy. Reproductive: Prostate is unremarkable. Other: No abdominal wall hernia or abnormality. No abdominopelvic ascites. Musculoskeletal: No acute or significant osseous findings. IMPRESSION: 1. No acute intra-abdominal or intrapelvic pathology. No urinary tract calculi or evidence of obstructive uropathy. 2. Hepatic steatosis.  Electronically Signed   By: Audie Pinto M.D.   On: 12/04/2019 18:57    Procedures Procedures (including critical care time)  Medications Ordered in ED Medications - No data to display  ED Course  I have reviewed the triage vital signs and the nursing notes.  Pertinent labs & imaging results that were available during my care of the patient were reviewed by me and considered in my medical decision making (see chart for details).    MDM Rules/Calculators/A&P                         Patient is a 39 year old man who presents today for evaluation of 45 to 50 days of dysuria.  He has been seen at outside facility, started on Cipro however continues to have symptoms.  Here his UA is unremarkable.  On exam he does have a small area next to the base of the penis concerning for herpes, however I suspect this is unrelated to his penile pain and dysuria..  He has not previously been diagnosed with this however states that it has been present since he was 17 after he had started having sexual contact.  He is given a prescription for Valtrex.  On my exam it appeared he had debris from a possible kidney stone in his urethra when swabbed for GC.  At this point no plan for empiric treatment.  He does have a history of renal stones, therefore CT renal is obtained without evidence of urinary tract calculi, obstructing stone, significant prostate enlargement or other cause for his symptoms.  Recommended the patient follow-up with urology.  It does appear that he is able to fully empty his bladder.  Urine culture is sent.  He declined testing for HIV or syphilis.  He is aware that he has testing for gonorrhea chlamydia and that if these are positive he will need to be treated.  Here he is afebrile, not tachycardic or tachypneic and overall well-appearing.  He does not have any rectal pain, doubt prostatitis.  No testicular pain.  Return precautions were discussed with patient who states their  understanding.  At the time of discharge patient denied any unaddressed complaints or concerns.  Patient is agreeable for discharge home.  Note: Portions of this report may have been transcribed using voice recognition  software. 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

## 2019-12-04 NOTE — ED Triage Notes (Signed)
Pt presents with c/o difficulty urinating. Pt reports he is able to pass urine but that it is becoming more difficult to do say. Pt also reports some burning with urination. Pt reports he has been to the doctor for the same since the symptoms began approx 4 months ago.

## 2019-12-04 NOTE — Discharge Instructions (Addendum)
Please continue taking your medications.  Your prostate on your scan looked normal and I recommend you follow up with your primary care doctor to discuss if additional evaluation is needed.   Please follow up with urology.    You have test for gonorrhea and chlamydia pending.  If these are positive you should receive a call and will need to get treatment.  You can also follow your results in [facility].

## 2019-12-04 NOTE — ED Notes (Signed)
Patient left prior to receiving discharge instructions. Without notifying the nurse or other staff.

## 2019-12-06 LAB — URINE CULTURE: Culture: NO GROWTH

## 2019-12-07 LAB — GC/CHLAMYDIA PROBE AMP (~~LOC~~) NOT AT ARMC
Chlamydia: NEGATIVE
Comment: NEGATIVE
Comment: NORMAL
Neisseria Gonorrhea: NEGATIVE

## 2020-12-16 IMAGING — CT CT RENAL STONE PROTOCOL
2 of 4 series · 16 of 46 positions shown, 18 images · non-contrast
Comparison: None.

CLINICAL DATA: Pt presents with c/o difficulty urinating. Pt
reports he is able to pass urine but that it is becoming more
difficult to do say.

EXAM:
CT ABDOMEN AND PELVIS WITHOUT CONTRAST
TECHNIQUE: Multidetector CT imaging of the abdomen and pelvis was performed
following the standard protocol without IV contrast.

[Series 2: axial st · axial · 0.73mm/px · z∈[+1005,+1445]mm · 13 of 100 slices shown, 15 images]
[im 6/100  soft-tissue]
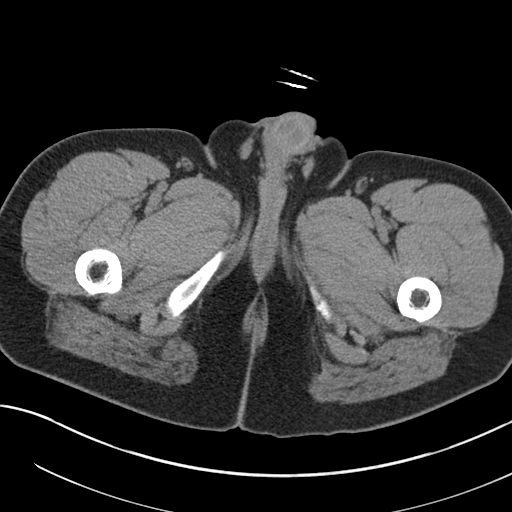
[im 6/100  bone]
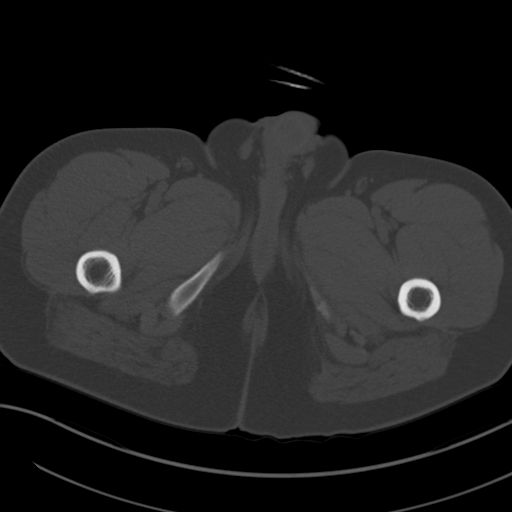
[im 16/100  soft-tissue]
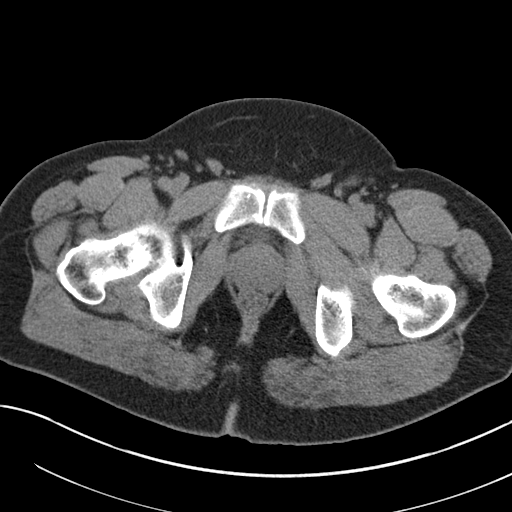
[im 21/100  soft-tissue]
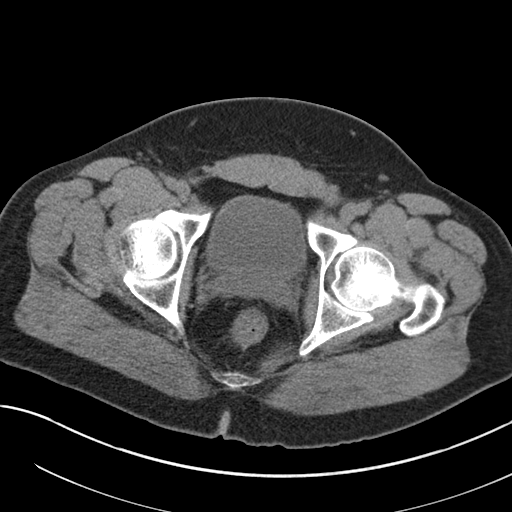
[im 27/100  soft-tissue]
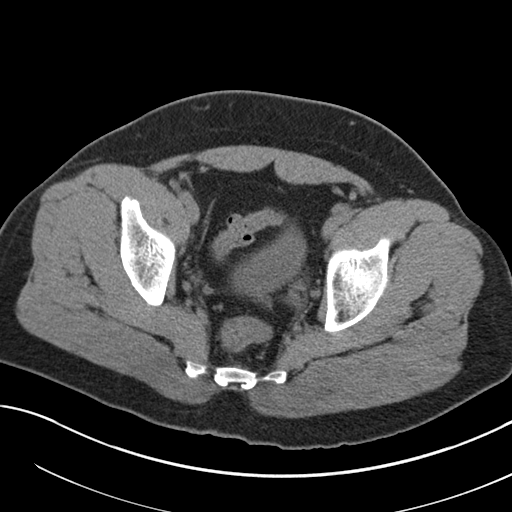
[im 37/100  soft-tissue]
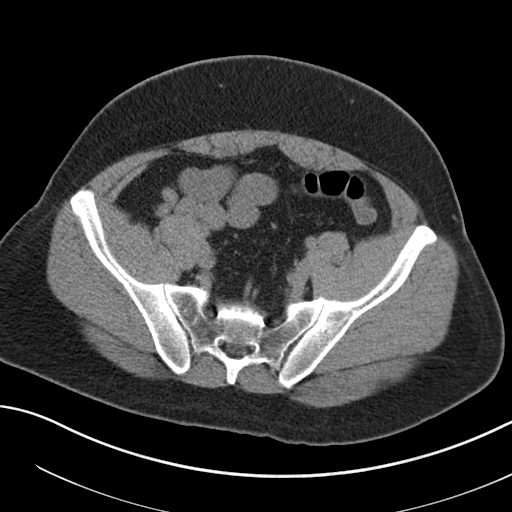
[im 42/100  soft-tissue]
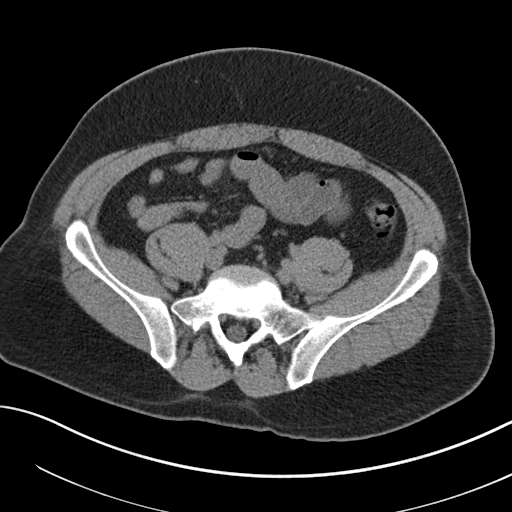
[im 53/100  soft-tissue]
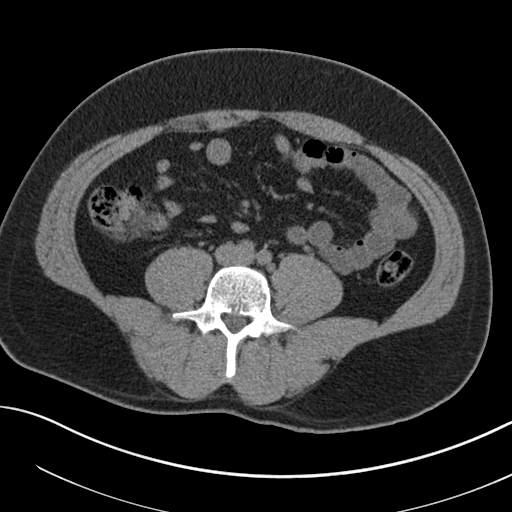
[im 58/100  soft-tissue]
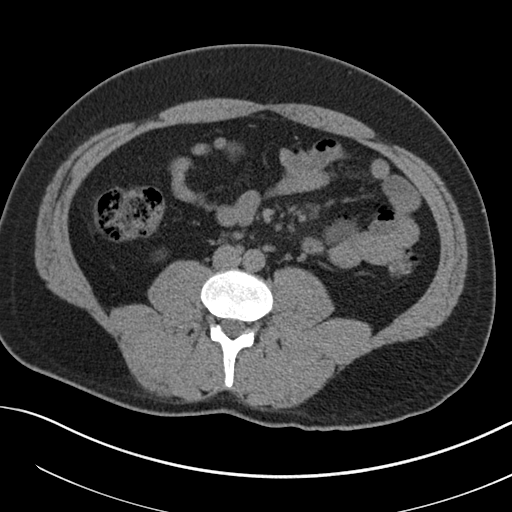
[im 63/100  soft-tissue]
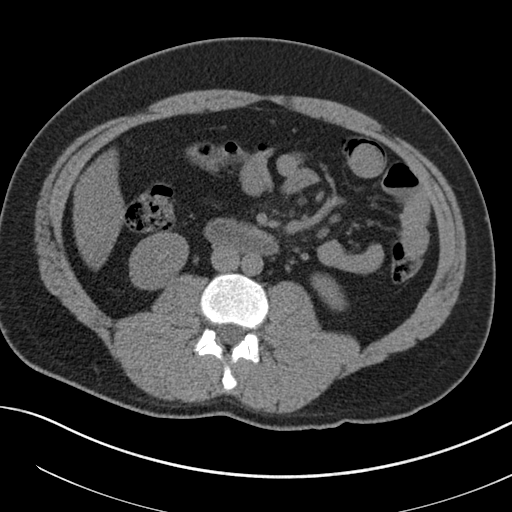
[im 63/100  bone]
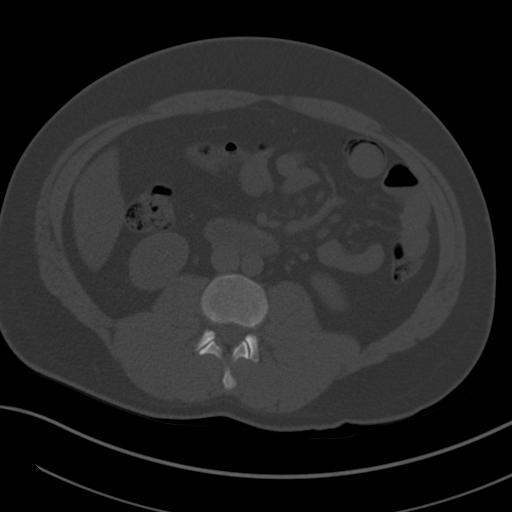
[im 73/100  soft-tissue]
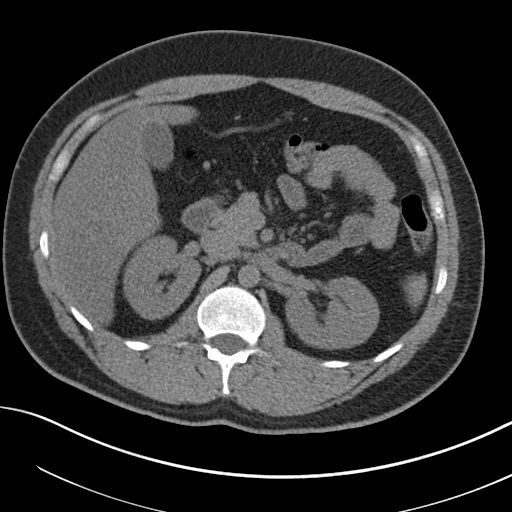
[im 79/100  soft-tissue]
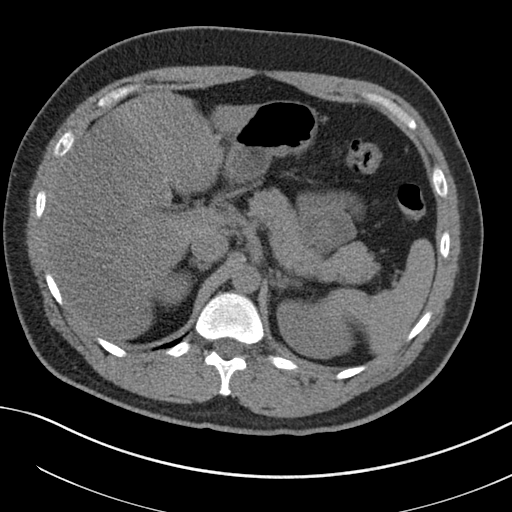
[im 84/100  soft-tissue]
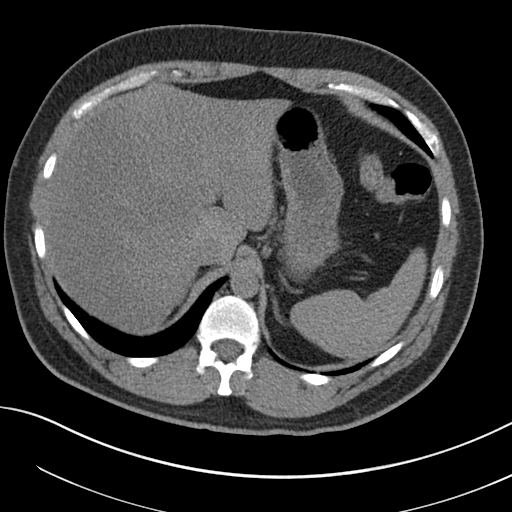
[im 94/100  soft-tissue]
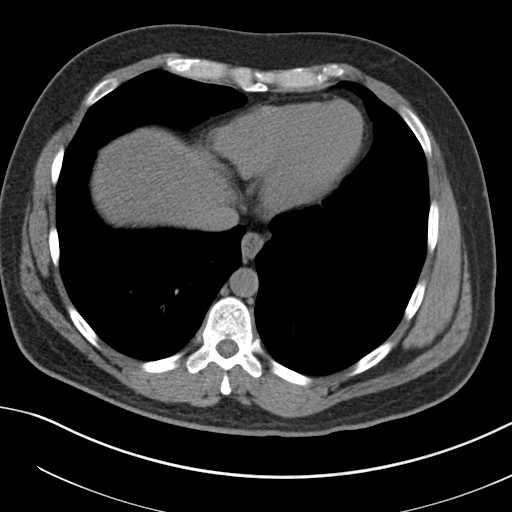

[Series 5: coronal · coronal · 0.80mm/px · 3 of 151 slices shown]
[im 51/151  soft-tissue]
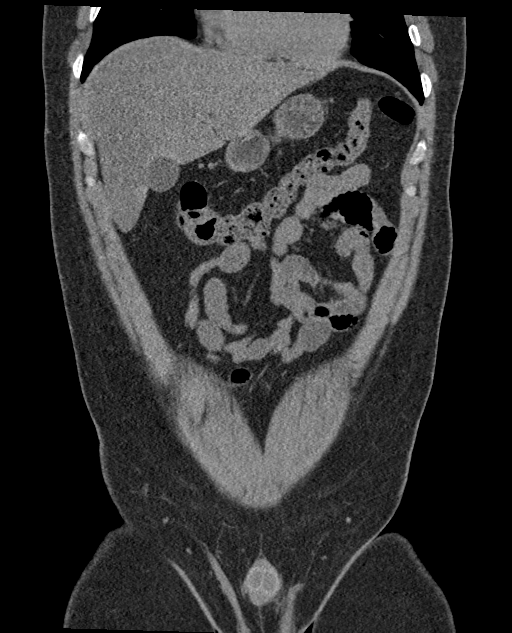
[im 67/151  soft-tissue]
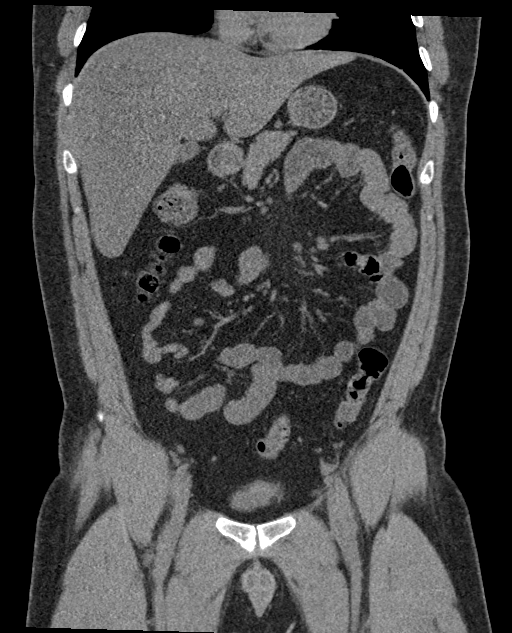
[im 84/151  soft-tissue]
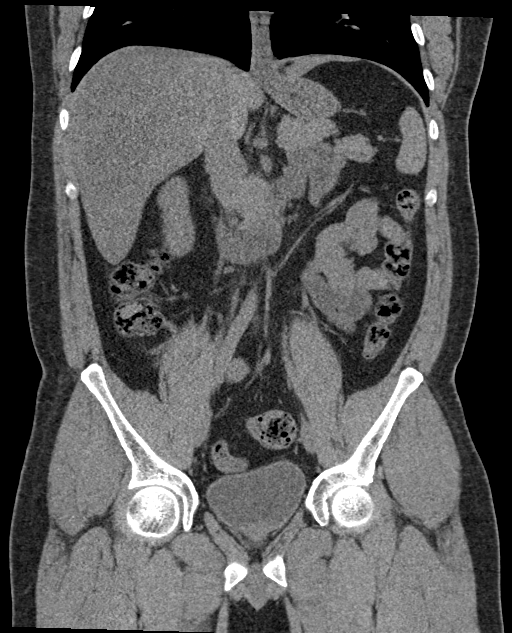

[16 of 46 positions shown; findings below may reference images not displayed]

FINDINGS: Lower chest: No acute abnormality.

Evaluation of the abdominal viscera limited by the lack of IV
contrast.

Hepatobiliary: Fatty infiltration of the liver. No focal liver
abnormality is seen. No gallstones, gallbladder wall thickening, or
biliary dilatation.

Pancreas: Unremarkable.

Spleen: Normal in size without focal abnormality.

Adrenals/Urinary Tract: Adrenal glands are unremarkable. Kidneys are
symmetric in size. No renal or ureteral calculi. No hydronephrosis.
Urinary bladder is unremarkable.

Stomach/Bowel: Stomach is within normal limits. Appendix appears
normal. No evidence of bowel wall thickening, distention, or
inflammatory changes. Few colonic diverticula.

Vascular/Lymphatic: No lymphadenopathy.

Reproductive: Prostate is unremarkable.

Other: No abdominal wall hernia or abnormality. No abdominopelvic
ascites.

Musculoskeletal: No acute or significant osseous findings.
IMPRESSION: 1. No acute intra-abdominal or intrapelvic pathology. No urinary
tract calculi or evidence of obstructive uropathy.
2. Hepatic steatosis.

## 2021-10-25 ENCOUNTER — Encounter: Payer: Self-pay | Admitting: Gastroenterology

## 2021-11-09 ENCOUNTER — Ambulatory Visit (AMBULATORY_SURGERY_CENTER): Payer: Self-pay | Admitting: *Deleted

## 2021-11-09 VITALS — Ht 68.5 in | Wt 203.0 lb

## 2021-11-09 DIAGNOSIS — Z86010 Personal history of colonic polyps: Secondary | ICD-10-CM

## 2021-11-09 MED ORDER — NA SULFATE-K SULFATE-MG SULF 17.5-3.13-1.6 GM/177ML PO SOLN: 1.0000 | ORAL | 0 refills | Status: AC

## 2021-11-09 NOTE — Progress Notes (Signed)
Patient is here in-person for PV. Patient denies any allergies to eggs or soy. Patient denies any problems with anesthesia/sedation. Patient is not on any oxygen at home. Patient is not taking any diet/weight loss medications or blood thinners. Went over procedure prep instructions with the patient. Patient is aware of our care-partner policy. Patient notified to use Good-Rx for prescription.    

## 2021-12-03 ENCOUNTER — Encounter (HOSPITAL_COMMUNITY): Payer: Self-pay | Admitting: Emergency Medicine

## 2021-12-03 ENCOUNTER — Encounter: Payer: Self-pay | Admitting: Gastroenterology

## 2021-12-03 ENCOUNTER — Ambulatory Visit (HOSPITAL_COMMUNITY)
Admission: EM | Admit: 2021-12-03 | Discharge: 2021-12-03 | Disposition: A | Discharge status: Home / Self Care | Attending: Family Medicine | Admitting: Family Medicine

## 2021-12-03 ENCOUNTER — Ambulatory Visit (AMBULATORY_SURGERY_CENTER): Payer: No Typology Code available for payment source | Admitting: Gastroenterology

## 2021-12-03 VITALS — BP 111/72 | HR 61 | Temp 96.4°F | Resp 19 | Ht 68.5 in | Wt 203.0 lb

## 2021-12-03 DIAGNOSIS — Z86010 Personal history of colonic polyps: Secondary | ICD-10-CM

## 2021-12-03 DIAGNOSIS — L237 Allergic contact dermatitis due to plants, except food: Secondary | ICD-10-CM | POA: Diagnosis not present

## 2021-12-03 DIAGNOSIS — Z09 Encounter for follow-up examination after completed treatment for conditions other than malignant neoplasm: Secondary | ICD-10-CM | POA: Diagnosis present

## 2021-12-03 MED ORDER — SODIUM CHLORIDE 0.9 % IV SOLN: 500.0000 mL | INTRAVENOUS | Status: AC

## 2021-12-03 MED ORDER — PREDNISONE 10 MG (21) PO TBPK: ORAL_TABLET | Freq: Every day | ORAL | 0 refills | Status: AC

## 2021-12-03 NOTE — Progress Notes (Signed)
Referring Provider: Lin Landsman, MD Primary Care Physician:  Lin Landsman, MD  Indication for Procedure:  Colon cancer Surveillance   IMPRESSION:  Need for colon cancer surveillance Appropriate candidate for monitored anesthesia care  PLAN: Colonoscopy in the [place] today   HPI: Jeremy Mendoza is a 41 y.o. male presents for surveillance colonoscopy.  Prior endoscopic history:    -4 tubular adenomas and a hyperplastic polyp removed 10/20/2018    -Surveillance colonoscopy recommended in 2023  No baseline GI symptoms.   No known family history of colon cancer or polyps. No family history of uterine/endometrial cancer, pancreatic cancer or gastric/stomach cancer.   Past Medical History:  Diagnosis Date   Anal fissure    Anxiety    COVID-19 03/19/2019   HEADACHE, COUGH, SYMPTOMS RESOLVED QUICKLY,  still has decreased sense of smell   Hemorrhoids    History of kidney stones    Hypertension 03/2019   HAD LISINOPRIL 10 MG PRESCRIBED NEVER TOOK DUE TO BP RUIING OK    Past Surgical History:  Procedure Laterality Date   cololscopy  10/10/2018   COLONOSCOPY  10/20/2018   Dr.Lashana Mendoza-polyps   NO PAST SURGERIES     POLYPECTOMY     RECTAL EXAM UNDER ANESTHESIA N/A 07/07/2019   Procedure: ANAL EXAM UNDER ANESTHESIA;  Surgeon: Jeremy Ruff, MD;  Location: [facility];  Service: General;  Laterality: N/A;   RECTAL EXAM UNDER ANESTHESIA N/A 10/15/2019   Procedure: ANAL EXAM UNDER ANESTHESIA;  Surgeon: Jeremy Ruff, MD;  Location: [facility];  Service: General;  Laterality: N/A;   SPHINCTEROTOMY N/A 07/07/2019   Procedure: CHEMICAL SPHINCTEROTOMY;  Surgeon: Jeremy Ruff, MD;  Location: [facility];  Service: General;  Laterality: N/A;   SPHINCTEROTOMY N/A 10/15/2019   Procedure: LATERAL INTERNAL SPHINCTEROTOMY;  Surgeon: Jeremy Ruff, MD;  Location: [facility];  Service: General;  Laterality: N/A;    Current  Outpatient Medications  Medication Sig Dispense Refill   Na Sulfate-K Sulfate-Mg Sulf 17.5-3.13-1.6 GM/177ML SOLN Take 1 kit by mouth as directed. May use generic SUPREP;NO prior authorizations will be done.Please use Singlecare or GOOD-RX coupon. 354 mL 0   Multiple Vitamin (MULTIVITAMIN) tablet Take 1 tablet by mouth daily.     Current Facility-Administered Medications  Medication Dose Route Frequency Provider Last Rate Last Admin   0.9 %  sodium chloride infusion  500 mL Intravenous Continuous Jeremy Park, MD        Allergies as of 12/03/2021   (No Known Allergies)    Family History  Problem Relation Age of Onset   Prostate cancer Father    Lung cancer Father    Liver cancer Paternal Aunt    Prostate cancer Paternal Uncle    Colon cancer Neg Hx    Esophageal cancer Neg Hx    Rectal cancer Neg Hx    Stomach cancer Neg Hx      Physical Exam: General:   Alert,  well-nourished, pleasant and cooperative in NAD Head:  Normocephalic and atraumatic. Eyes:  Sclera clear, no icterus.   Conjunctiva pink. Mouth:  No deformity or lesions.   Neck:  Supple; no masses or thyromegaly. Lungs:  Clear throughout to auscultation.   No wheezes. Heart:  Regular rate and rhythm; no murmurs. Abdomen:  Soft, non-tender, nondistended, normal bowel sounds, no rebound or guarding.  Msk:  Symmetrical. No boney deformities LAD: No inguinal or umbilical LAD Extremities:  No clubbing or edema. Neurologic:  Alert and  oriented x4;  grossly nonfocal Skin:  No obvious rash or bruise. Psych:  Alert and cooperative. Normal mood and affect.     Studies/Results: No results found.    Jeremy Mendoza L. Tarri Glenn, MD, MPH 12/03/2021, 1:24 PM

## 2021-12-03 NOTE — ED Provider Notes (Signed)
[facility]    CSN: [id number] Arrival date & time: 12/03/21  1837      History   Chief Complaint Chief Complaint  Patient presents with   Rash    HPI Jeremy Mendoza is a 41 y.o. male.   HPI 41 year old male presents with a rash of arms for 10 days.  Patient believes this may be related to poison ivy.  Reports it is pruritic and has tried OTC cortisone cream with little to no relief.  PMH significant for anxiety, history of kidney stones and HTN.  Past Medical History:  Diagnosis Date   Anal fissure    Anxiety    COVID-19 03/19/2019   HEADACHE, COUGH, SYMPTOMS RESOLVED QUICKLY,  still has decreased sense of smell   Hemorrhoids    History of kidney stones    Hypertension 03/2019   HAD LISINOPRIL 10 MG PRESCRIBED NEVER TOOK DUE TO BP RUIING OK    There are no problems to display for this patient.   Past Surgical History:  Procedure Laterality Date   cololscopy  10/10/2018   COLONOSCOPY  10/20/2018   Dr.Beavers-polyps   NO PAST SURGERIES     POLYPECTOMY     RECTAL EXAM UNDER ANESTHESIA N/A 07/07/2019   Procedure: ANAL EXAM UNDER ANESTHESIA;  Surgeon: Leighton Ruff, MD;  Location: [facility];  Service: General;  Laterality: N/A;   RECTAL EXAM UNDER ANESTHESIA N/A 10/15/2019   Procedure: ANAL EXAM UNDER ANESTHESIA;  Surgeon: Leighton Ruff, MD;  Location: [facility];  Service: General;  Laterality: N/A;   SPHINCTEROTOMY N/A 07/07/2019   Procedure: CHEMICAL SPHINCTEROTOMY;  Surgeon: Leighton Ruff, MD;  Location: [facility];  Service: General;  Laterality: N/A;   SPHINCTEROTOMY N/A 10/15/2019   Procedure: LATERAL INTERNAL SPHINCTEROTOMY;  Surgeon: Leighton Ruff, MD;  Location: [place];  Service: General;  Laterality: N/A;       Home Medications    Prior to Admission medications   Medication Sig Start Date End Date Taking? Authorizing Provider  predniSONE (STERAPRED UNI-PAK 21 TAB) 10  MG (21) TBPK tablet Take by mouth daily. Take 6 tabs by mouth daily  for 2 days, then 5 tabs for 2 days, then 4 tabs for 2 days, then 3 tabs for 2 days, 2 tabs for 2 days, then 1 tab by mouth daily for 2 days 12/03/21  Yes Eliezer Lofts, FNP  Multiple Vitamin (MULTIVITAMIN) tablet Take 1 tablet by mouth daily.    [provider]  Na Sulfate-K Sulfate-Mg Sulf 17.5-3.13-1.6 GM/177ML SOLN Take 1 kit by mouth as directed. May use generic SUPREP;NO prior authorizations will be done.Please use Singlecare or GOOD-RX coupon. 11/09/21   Thornton Park, MD    Family History Family History  Problem Relation Age of Onset   Prostate cancer Father    Lung cancer Father    Liver cancer Paternal Aunt    Prostate cancer Paternal Uncle    Colon cancer Neg Hx    Esophageal cancer Neg Hx    Rectal cancer Neg Hx    Stomach cancer Neg Hx     Social History Social History   Tobacco Use   Smoking status: Former    Types: Cigarettes   Smokeless tobacco: Never   Tobacco comments:    social  Media planner   Vaping Use: Never used  Substance Use Topics   Alcohol use: No   Drug use: No     Allergies   Patient has no  known allergies.   Review of Systems Review of Systems  Skin:  Positive for rash.  All other systems reviewed and are negative.    Physical Exam Triage Vital Signs ED Triage Vitals  Enc Vitals Group     BP 12/03/21 1931 123/82     Pulse Rate 12/03/21 1931 75     Resp 12/03/21 1931 16     Temp 12/03/21 1931 98.4 F (36.9 C)     Temp src --      SpO2 12/03/21 1931 96 %     Weight --      Height --      Head Circumference --      Peak Flow --      Pain Score 12/03/21 1930 0     Pain Loc --      Pain Edu? --      Excl. in [place]? --    No data found.  Updated Vital Signs BP 123/82 (BP Location: Right Arm)   Pulse 75   Temp 98.4 F (36.9 C)   Resp 16   SpO2 96%       Physical Exam Vitals and nursing note reviewed.  Constitutional:      Appearance: Normal  appearance. He is normal weight.  HENT:     Head: Normocephalic and atraumatic.     Mouth/Throat:     Mouth: Mucous membranes are moist.     Pharynx: Oropharynx is clear.  Eyes:     Extraocular Movements: Extraocular movements intact.     Conjunctiva/sclera: Conjunctivae normal.     Pupils: Pupils are equal, round, and reactive to light.  Cardiovascular:     Rate and Rhythm: Normal rate.     Pulses: Normal pulses.     Heart sounds: Normal heart sounds.  Pulmonary:     Effort: Pulmonary effort is normal.     Breath sounds: Normal breath sounds. No wheezing, rhonchi or rales.  Musculoskeletal:        General: Normal range of motion.     Cervical back: Normal range of motion and neck supple.  Skin:    General: Skin is warm and dry.     Comments: Left lower arm (volar aspect): Erythematous maculopapular eruption, with grouped vesicular lesions noted-please see image below  Neurological:     General: No focal deficit present.     Mental Status: He is alert and oriented to person, place, and time. Mental status is at baseline.       UC Treatments / Results  Labs (all labs ordered are listed, but only abnormal results are displayed) Labs Reviewed - No data to display  EKG   Radiology No results found.  Procedures Procedures (including critical care time)  Medications Ordered in UC Medications - No data to display  Initial Impression / Assessment and Plan / UC Course  I have reviewed the triage vital signs and the nursing notes.  Pertinent labs & imaging results that were available during my care of the patient were reviewed by me and considered in my medical decision making (see chart for details).     MDM: 1.  Poison ivy dermatitis-Rx'd Sterapred Unipak. Advised patient to take medication as directed with food to completion.  Instructed patient to start medication first thing tomorrow morning Tuesday, 12/04/2021.  Encouraged patient increase daily water intake while  taking this medication.  Advised patient if symptoms worsen and/or unresolved please follow-up with PCP or here for further evaluation.  Final Clinical Impressions(s) /  UC Diagnoses   Final diagnoses:  Poison ivy dermatitis     Discharge Instructions      Advised patient to take medication as directed with food to completion.  Instructed patient to start medication first thing tomorrow morning Tuesday, 12/04/2021.  Encouraged patient increase daily water intake while taking this medication.  Advised patient if symptoms worsen and/or unresolved please follow-up with PCP or here for further evaluation.     ED Prescriptions     Medication Sig Dispense Auth. Provider   predniSONE (STERAPRED UNI-PAK 21 TAB) 10 MG (21) TBPK tablet Take by mouth daily. Take 6 tabs by mouth daily  for 2 days, then 5 tabs for 2 days, then 4 tabs for 2 days, then 3 tabs for 2 days, 2 tabs for 2 days, then 1 tab by mouth daily for 2 days 42 tablet Eliezer Lofts, FNP      PDMP not reviewed this encounter.   Eliezer Lofts, [place] 12/03/21 [phone]

## 2021-12-03 NOTE — Discharge Instructions (Addendum)
Advised patient to take medication as directed with food to completion.  Instructed patient to start medication first thing tomorrow morning Tuesday, 12/04/2021.  Encouraged patient increase daily water intake while taking this medication.  Advised patient if symptoms worsen and/or unresolved please follow-up with PCP or here for further evaluation.

## 2021-12-03 NOTE — Progress Notes (Signed)
Pt's states no medical or surgical changes since previsit or office visit. 

## 2021-12-03 NOTE — Op Note (Signed)
[facility] Patient Name: Jeremy Mendoza Procedure Date: 12/03/2021 1:33 PM MRN: [id number] Endoscopist: Thornton Park MD, MD Age: 41 Referring MD:  Date of Birth: 26-Nov-1980 Gender: Male Account #: [id number] Procedure:                Colonoscopy Indications:              High risk colon cancer surveillance: Personal                            history of multiple (3 or more) adenomas                           Colonoscopy 10/20/2018: 4 tubular adenomas and a                            hyperplastic polyps Medicines:                Monitored Anesthesia Care Procedure:                Pre-Anesthesia Assessment:                           - Prior to the procedure, a History and Physical                            was performed, and patient medications and                            allergies were reviewed. The patient's tolerance of                            previous anesthesia was also reviewed. The risks                            and benefits of the procedure and the sedation                            options and risks were discussed with the patient.                            All questions were answered, and informed consent                            was obtained. Prior Anticoagulants: The patient has                            taken no previous anticoagulant or antiplatelet                            agents. ASA Grade Assessment: II - A patient with                            mild systemic disease. After reviewing the risks  and benefits, the patient was deemed in                            satisfactory condition to undergo the procedure.                           After obtaining informed consent, the colonoscope                            was passed under direct vision. Throughout the                            procedure, the patient's blood pressure, pulse, and                            oxygen saturations were monitored continuously. The                             CF [id number] #[id number] was introduced through the anus                            and advanced to the 3 cm into the ileum. A second                            forward view of the right colon was performed. The                            colonoscopy was performed without difficulty. The                            patient tolerated the procedure well. The quality                            of the bowel preparation was excellent. The                            terminal ileum, ileocecal valve, appendiceal                            orifice, and rectum were photographed. Scope In: 1:40:14 PM Scope Out: 1:52:39 PM Scope Withdrawal Time: 0 hours 9 minutes 25 seconds  Total Procedure Duration: 0 hours 12 minutes 25 seconds  Findings:                 The perianal and digital rectal examinations were                            normal.                           Non-bleeding internal hemorrhoids were found.                           The exam was otherwise without abnormality on  direct and retroflexion views. Complications:            No immediate complications. Estimated Blood Loss:     Estimated blood loss: none. Impression:               - Non-bleeding internal hemorrhoids.                           - The examination was otherwise normal on direct                            and retroflexion views.                           - No specimens collected. Recommendation:           - Patient has a contact number available for                            emergencies. The signs and symptoms of potential                            delayed complications were discussed with the                            patient. Return to normal activities tomorrow.                            Written discharge instructions were provided to the                            patient.                           - Resume previous diet.                           - Continue present  medications.                           - Repeat colonoscopy in 5 years for surveillance.                           - Emerging evidence supports eating a diet of                            fruits, vegetables, grains, calcium, and yogurt                            while reducing red meat and alcohol may reduce the                            risk of colon cancer.                           - Thank you for allowing me to be involved in your  colon cancer prevention. Thornton Park MD, MD 12/03/2021 1:59:09 PM This report has been signed electronically.

## 2021-12-03 NOTE — Progress Notes (Signed)
Sedate, gd SR, tolerated procedure well, VSS, report to RN 

## 2021-12-03 NOTE — Patient Instructions (Signed)
Impression/Recommendations:  Hemorrhoid handout given to patient.  Resume previous diet. Continue present medications. Repeat colonoscopy in 5 years for surveillance.  YOU HAD AN ENDOSCOPIC PROCEDURE TODAY AT [facility]:   Refer to the procedure report that was given to you for any specific questions about what was found during the examination.  If the procedure report does not answer your questions, please call your gastroenterologist to clarify.  If you requested that your care partner not be given the details of your procedure findings, then the procedure report has been included in a sealed envelope for you to review at your convenience later.  YOU SHOULD EXPECT: Some feelings of bloating in the abdomen. Passage of more gas than usual.  Walking can help get rid of the air that was put into your GI tract during the procedure and reduce the bloating. If you had a lower endoscopy (such as a colonoscopy or flexible sigmoidoscopy) you may notice spotting of blood in your stool or on the toilet paper. If you underwent a bowel prep for your procedure, you may not have a normal bowel movement for a few days.  Please Note:  You might notice some irritation and congestion in your nose or some drainage.  This is from the oxygen used during your procedure.  There is no need for concern and it should clear up in a day or so.  SYMPTOMS TO REPORT IMMEDIATELY:  Following lower endoscopy (colonoscopy or flexible sigmoidoscopy):  Excessive amounts of blood in the stool  Significant tenderness or worsening of abdominal pains  Swelling of the abdomen that is new, acute  Fever of 100F or higher  For urgent or emergent issues, a gastroenterologist can be reached at any hour by calling [phone]. Do not use MyChart messaging for urgent concerns.    DIET:  We do recommend a small meal at first, but then you may proceed to your regular diet.  Drink plenty of fluids but you should avoid  alcoholic beverages for 24 hours.  ACTIVITY:  You should plan to take it easy for the rest of today and you should NOT DRIVE or use heavy machinery until tomorrow (because of the sedation medicines used during the test).    FOLLOW UP: Our staff will call the number listed on your records the next business day following your procedure.  We will call around 7:15- 8:00 am to check on you and address any questions or concerns that you may have regarding the information given to you following your procedure. If we do not reach you, we will leave a message.     If any biopsies were taken you will be contacted by phone or by letter within the next 1-3 weeks.  Please call us at [phone] if you have not heard about the biopsies in 3 weeks.    SIGNATURES/CONFIDENTIALITY: You and/or your care partner have signed paperwork which will be entered into your electronic medical record.  These signatures attest to the fact that that the information above on your After Visit Summary has been reviewed and is understood.  Full responsibility of the confidentiality of this discharge information lies with you and/or your care-partner.

## 2021-12-03 NOTE — ED Triage Notes (Signed)
Pt c/o rash for 10 days on bilat arms. Reports itching. Tried cortisone cream without relief.

## 2021-12-04 ENCOUNTER — Telehealth: Payer: Self-pay | Admitting: *Deleted

## 2021-12-04 NOTE — Telephone Encounter (Signed)
Attempted to call patient for their post-procedure follow-up call. No answer. Left voicemail.   

## 2023-05-05 ENCOUNTER — Other Ambulatory Visit: Payer: Self-pay

## 2023-05-05 ENCOUNTER — Encounter: Payer: Self-pay | Admitting: *Deleted

## 2023-05-05 ENCOUNTER — Ambulatory Visit
Admission: EM | Admit: 2023-05-05 | Discharge: 2023-05-05 | Disposition: A | Discharge status: Home / Self Care | Attending: Emergency Medicine | Admitting: Emergency Medicine

## 2023-05-05 DIAGNOSIS — M545 Low back pain, unspecified: Secondary | ICD-10-CM | POA: Diagnosis not present

## 2023-05-05 DIAGNOSIS — M62830 Muscle spasm of back: Secondary | ICD-10-CM | POA: Diagnosis not present

## 2023-05-05 DIAGNOSIS — J029 Acute pharyngitis, unspecified: Secondary | ICD-10-CM

## 2023-05-05 DIAGNOSIS — J069 Acute upper respiratory infection, unspecified: Secondary | ICD-10-CM

## 2023-05-05 DIAGNOSIS — Z720 Tobacco use: Secondary | ICD-10-CM

## 2023-05-05 DIAGNOSIS — Z0289 Encounter for other administrative examinations: Secondary | ICD-10-CM | POA: Insufficient documentation

## 2023-05-05 LAB — POCT RAPID STREP A (OFFICE): Rapid Strep A Screen: NEGATIVE

## 2023-05-05 MED ORDER — CYCLOBENZAPRINE HCL 5 MG PO TABS: 5.0000 mg | ORAL_TABLET | Freq: Three times a day (TID) | ORAL | 0 refills | Status: AC | PRN

## 2023-05-05 NOTE — Discharge Instructions (Addendum)
 Your strep is negative. Most likely you have a viral illness: no antibiotic is indicated at this time, May treat with OTC meds of choice. Make sure to drink plenty of fluids to stay hydrated(gatorade, water, popsicles,jello,etc), avoid caffeine products.  Take muscle relaxers for back pain. Take home meds as directed. May use heat or ice to back for comfort. May use lidocaine patch or biofreeze for pain. Please follow up with PCP, may need to referral to physical therapy for further back pain management. GO immediately to ER for loss of bowel and bladder,loss of function, saddle numbness, etc.

## 2023-05-05 NOTE — ED Triage Notes (Signed)
 Pt reports sore throat, subjective fever, congestion x 4 days. His son was sick with similar symptoms. His also c/o back pain x 3 weeks which he believes is from work. Traveled to [place] last month. Took tylenol this morning

## 2023-05-05 NOTE — ED Provider Notes (Signed)
 [facility]    CSN: [id number] Arrival date & time: 05/05/23  0809      History   Chief Complaint Chief Complaint  Patient presents with   Sore Throat    HPI Jeremy Mendoza is a 43 y.o. male.   43 year old male pt Jeremy Mendoza, presents to urgent care for evaluation of sore throat and fever and congestion x 4 days. Son recently sick with same symptoms.   Also complaining of back pain x 3 weeks, pt states he jerked up something at home and pulled something in his back, took OTC meds, "has flares up".  Recently traveled to [place] last month, took tylenol this am for symptom management.   The history is provided by the patient. No language interpreter was used.    Past Medical History:  Diagnosis Date   Anal fissure    Anxiety    COVID-19 03/19/2019   HEADACHE, COUGH, SYMPTOMS RESOLVED QUICKLY,  still has decreased sense of smell   Hemorrhoids    History of kidney stones    Hypertension 03/2019   HAD LISINOPRIL 10 MG PRESCRIBED NEVER TOOK DUE TO BP RUIING OK    Patient Active Problem List   Diagnosis Date Noted   Health examination of defined subpopulation 05/05/2023   Viral URI 05/05/2023   Acute viral pharyngitis 05/05/2023   Acute left-sided low back pain without sciatica 05/05/2023   Spasm of muscle of lower back 05/05/2023   Vapes nicotine containing substance 05/05/2023    Past Surgical History:  Procedure Laterality Date   cololscopy  10/10/2018   COLONOSCOPY  10/20/2018   Dr.Beavers-polyps   NO PAST SURGERIES     POLYPECTOMY     RECTAL EXAM UNDER ANESTHESIA N/A 07/07/2019   Procedure: ANAL EXAM UNDER ANESTHESIA;  Surgeon: Romie Levee, MD;  Location: [facility];  Service: General;  Laterality: N/A;   RECTAL EXAM UNDER ANESTHESIA N/A 10/15/2019   Procedure: ANAL EXAM UNDER ANESTHESIA;  Surgeon: Romie Levee, MD;  Location: [facility];  Service: General;  Laterality: N/A;   SPHINCTEROTOMY N/A 07/07/2019    Procedure: CHEMICAL SPHINCTEROTOMY;  Surgeon: Romie Levee, MD;  Location: [facility];  Service: General;  Laterality: N/A;   SPHINCTEROTOMY N/A 10/15/2019   Procedure: LATERAL INTERNAL SPHINCTEROTOMY;  Surgeon: Romie Levee, MD;  Location: [facility] ;  Service: General;  Laterality: N/A;       Home Medications    Prior to Admission medications   Medication Sig Start Date End Date Taking? Authorizing Provider  cyclobenzaprine (FLEXERIL) 5 MG tablet Take 1 tablet (5 mg total) by mouth 3 (three) times daily as needed for up to 5 days for muscle spasms. 05/05/23 05/10/23 Yes Nerissa Constantin, Para March, NP  Multiple Vitamin (MULTIVITAMIN) tablet Take 1 tablet by mouth daily.    [provider]  Na Sulfate-K Sulfate-Mg Sulf 17.5-3.13-1.6 GM/177ML SOLN Take 1 kit by mouth as directed. May use generic SUPREP;NO prior authorizations will be done.Please use Singlecare or GOOD-RX coupon. 11/09/21   Tressia Danas, MD  predniSONE (STERAPRED UNI-PAK 21 TAB) 10 MG (21) TBPK tablet Take by mouth daily. Take 6 tabs by mouth daily  for 2 days, then 5 tabs for 2 days, then 4 tabs for 2 days, then 3 tabs for 2 days, 2 tabs for 2 days, then 1 tab by mouth daily for 2 days 12/03/21   Trevor Iha, FNP    Family History Family History  Problem Relation Age of Onset  Prostate cancer Father    Lung cancer Father    Liver cancer Paternal Aunt    Prostate cancer Paternal Uncle    Colon cancer Neg Hx    Esophageal cancer Neg Hx    Rectal cancer Neg Hx    Stomach cancer Neg Hx     Social History Social History   Tobacco Use   Smoking status: Former    Types: Cigarettes   Smokeless tobacco: Never   Tobacco comments:    social  Advertising account planner   Vaping status: Every Day  Substance Use Topics   Alcohol use: No   Drug use: No     Allergies   Patient has no known allergies.   Review of Systems Review of Systems  Constitutional:  Positive for fever.  HENT:   Positive for sore throat.   Musculoskeletal:  Positive for back pain and myalgias.  Neurological:  Negative for weakness.  All other systems reviewed and are negative.    Physical Exam Triage Vital Signs ED Triage Vitals [05/05/23 0947]  Encounter Vitals Group     BP      Systolic BP Percentile      Diastolic BP Percentile      Pulse      Resp      Temp      Temp src      SpO2      Weight      Height      Head Circumference      Peak Flow      Pain Score 6     Pain Loc      Pain Education      Exclude from Growth Chart    No data found.  Updated Vital Signs BP 124/84 (BP Location: Left Arm)   Pulse 60   Temp 98 F (36.7 C) (Oral)   Resp 16   SpO2 97%   Visual Acuity Right Eye Distance:   Left Eye Distance:   Bilateral Distance:    Right Eye Near:   Left Eye Near:    Bilateral Near:     Physical Exam Vitals and nursing note reviewed.  Constitutional:      Appearance: Normal appearance. He is well-developed and well-groomed.  HENT:     Head: Normocephalic.  Cardiovascular:     Rate and Rhythm: Normal rate and regular rhythm.     Pulses:          Dorsalis pedis pulses are 2+ on the right side and 2+ on the left side.     Heart sounds: Normal heart sounds.  Pulmonary:     Effort: Pulmonary effort is normal.     Breath sounds: Normal breath sounds and air entry.  Musculoskeletal:     Lumbar back: Spasms and tenderness present. Negative right straight leg raise test and negative left straight leg raise test.       Back:  Neurological:     General: No focal deficit present.     Mental Status: He is alert and oriented to person, place, and time.     GCS: GCS eye subscore is 4. GCS verbal subscore is 5. GCS motor subscore is 6.     Cranial Nerves: No cranial nerve deficit.     Sensory: No sensory deficit.     Motor: Motor function is intact.     Coordination: Coordination is intact.     Gait: Gait is intact.  Psychiatric:  Attention and  Perception: Attention normal.        Mood and Affect: Mood normal.        Speech: Speech normal.        Behavior: Behavior normal. Behavior is cooperative.      UC Treatments / Results  Labs (all labs ordered are listed, but only abnormal results are displayed) Labs Reviewed  POCT RAPID STREP A (OFFICE) - Normal    EKG   Radiology No results found.  Procedures Procedures (including critical care time)  Medications Ordered in UC Medications - No data to display  Initial Impression / Assessment and Plan / UC Course  I have reviewed the triage vital signs and the nursing notes.  Pertinent labs & imaging results that were available during my care of the patient were reviewed by me and considered in my medical decision making (see chart for details).  Clinical Course as of 05/05/23 1057  Mon May 05, 2023  1005 Strep is negative [JD]    Clinical Course User Index [JD] Darelle Kings, Para March, NP  Discussed exam findings and plan of care with patient, strict go to ER precautions given.   Patient verbalized understanding to this provider.  Ddx: Viral URI,viral pharyngitis, allergies, back pain, muscle spasm,muscle strain Final Clinical Impressions(s) / UC Diagnoses   Final diagnoses:  Viral URI  Acute viral pharyngitis  Acute left-sided low back pain without sciatica  Spasm of muscle of lower back  Vapes nicotine containing substance     Discharge Instructions      Your strep is negative. Most likely you have a viral illness: no antibiotic is indicated at this time, May treat with OTC meds of choice. Make sure to drink plenty of fluids to stay hydrated(gatorade, water, popsicles,jello,etc), avoid caffeine products.  Take muscle relaxers for back pain. Take home meds as directed. May use heat or ice to back for comfort. May use lidocaine patch or biofreeze for pain. Please follow up with PCP, may need to referral to physical therapy for further back pain management. GO  immediately to ER for loss of bowel and bladder,loss of function, saddle numbness, etc.      ED Prescriptions     Medication Sig Dispense Auth. Provider   cyclobenzaprine (FLEXERIL) 5 MG tablet Take 1 tablet (5 mg total) by mouth 3 (three) times daily as needed for up to 5 days for muscle spasms. 15 tablet Lemya Greenwell, Para March, NP      PDMP not reviewed this encounter.   Clancy Gourd, NP 05/05/23 1057

## 2023-10-01 ENCOUNTER — Encounter: Payer: Self-pay | Admitting: Family Medicine

## 2023-10-09 ENCOUNTER — Encounter: Payer: Self-pay | Admitting: Family

## 2023-10-09 ENCOUNTER — Ambulatory Visit (INDEPENDENT_AMBULATORY_CARE_PROVIDER_SITE_OTHER): Admitting: Family

## 2023-10-09 VITALS — BP 122/85 | HR 65 | Temp 98.0°F | Resp 16 | Ht 69.0 in | Wt 210.8 lb

## 2023-10-09 DIAGNOSIS — M545 Low back pain, unspecified: Secondary | ICD-10-CM

## 2023-10-09 DIAGNOSIS — Z720 Tobacco use: Secondary | ICD-10-CM

## 2023-10-09 DIAGNOSIS — U070 Vaping-related disorder: Secondary | ICD-10-CM

## 2023-10-09 DIAGNOSIS — Z8042 Family history of malignant neoplasm of prostate: Secondary | ICD-10-CM

## 2023-10-09 DIAGNOSIS — R519 Headache, unspecified: Secondary | ICD-10-CM | POA: Diagnosis not present

## 2023-10-09 DIAGNOSIS — R5383 Other fatigue: Secondary | ICD-10-CM | POA: Diagnosis not present

## 2023-10-09 DIAGNOSIS — Z801 Family history of malignant neoplasm of trachea, bronchus and lung: Secondary | ICD-10-CM

## 2023-10-09 DIAGNOSIS — Z803 Family history of malignant neoplasm of breast: Secondary | ICD-10-CM

## 2023-10-09 DIAGNOSIS — Z125 Encounter for screening for malignant neoplasm of prostate: Secondary | ICD-10-CM | POA: Diagnosis not present

## 2023-10-09 DIAGNOSIS — Z7689 Persons encountering health services in other specified circumstances: Secondary | ICD-10-CM

## 2023-10-09 DIAGNOSIS — G8929 Other chronic pain: Secondary | ICD-10-CM

## 2023-10-09 MED ORDER — MELOXICAM 7.5 MG PO TABS: 7.5000 mg | ORAL_TABLET | Freq: Every day | ORAL | 0 refills | Status: AC

## 2023-10-09 NOTE — Progress Notes (Signed)
 Subjective:    Jeremy Mendoza - 43 y.o. male MRN [id number]  Date of birth: 04/15/1980  HPI  Jeremy Mendoza is to establish care.  Current issues and/or concerns: - Intermittent headaches. Denies red flag symptoms. Taking over-the-counter medications helping.  - States low energy. - Family history of prostate cancer.  - Family history of breast cancer.  - Family history of lung cancer. He vapes. - Chronic low back pain for 5 years. Denies recent trauma/injury and red flag symptoms. Taking over-the-counter medication with minimal relief.   ROS per HPI    Health Maintenance:  Health Maintenance Due  Topic Date Due   HIV Screening  Never done   Hepatitis C Screening  Never done   INFLUENZA VACCINE  10/03/2023     Past Medical History: Patient Active Problem List   Diagnosis Date Noted   Health examination of defined subpopulation 05/05/2023   Viral URI 05/05/2023   Acute viral pharyngitis 05/05/2023   Acute left-sided low back pain without sciatica 05/05/2023   Spasm of muscle of lower back 05/05/2023   Vapes nicotine containing substance 05/05/2023      Social History   reports that he has quit smoking. His smoking use included cigarettes. He has never used smokeless tobacco. He reports that he does not drink alcohol and does not use drugs.   Family History  family history includes Liver cancer in his paternal aunt; Lung cancer in his father; Prostate cancer in his father and paternal uncle.   Medications: reviewed and updated   Objective:   Physical Exam BP 122/85   Pulse 65   Temp 98 F (36.7 C) (Oral)   Resp 16   Ht 5' 9 (1.753 m)   Wt 210 lb 12.8 oz (95.6 kg)   SpO2 95%   BMI 31.13 kg/m   Physical Exam HENT:     Head: Normocephalic and atraumatic.     Nose: Nose normal.     Mouth/Throat:     Mouth: Mucous membranes are moist.     Pharynx: Oropharynx is clear.  Eyes:     Extraocular Movements: Extraocular movements intact.      Conjunctiva/sclera: Conjunctivae normal.     Pupils: Pupils are equal, round, and reactive to light.  Cardiovascular:     Rate and Rhythm: Normal rate and regular rhythm.     Pulses: Normal pulses.     Heart sounds: Normal heart sounds.  Pulmonary:     Effort: Pulmonary effort is normal.     Breath sounds: Normal breath sounds.  Musculoskeletal:        General: Normal range of motion.     Cervical back: Normal, normal range of motion and neck supple.     Thoracic back: Normal.     Lumbar back: Normal.  Neurological:     General: No focal deficit present.     Mental Status: He is alert and oriented to person, place, and time.  Psychiatric:        Mood and Affect: Mood normal.        Behavior: Behavior normal.         Assessment & Plan:  1. Encounter to establish care (Primary) - Patient presents today to establish care. During the interim follow-up with primary provider as scheduled.  - Return for annual physical examination, labs, and health maintenance. Arrive fasting meaning having no food for at least 8 hours prior to appointment. You may have only water or black coffee. Please take scheduled medications  as normal.  2. Nonintractable headache, unspecified chronicity pattern, unspecified headache type - Patient declined pharmacological therapy.  - Patient declined referral to Neurology.  - Follow-up with primary provider as scheduled.  3. Fatigue, unspecified type - Routine screening.  - CBC - TSH - Hemoglobin A1c - Vitamin D, 25-hydroxy  4. Family history of prostate cancer 5. Screening PSA (prostate specific antigen) - Routine screening.  - PSA  6. Family history of breast cancer - Routine screening.  - MM Digital Screening; Future  7. Family history of lung cancer 8. Vapes nicotine containing substance - Routine screening.  - CT CHEST LUNG CA SCREEN LOW DOSE W/O CM; Future  9. Chronic low back pain, unspecified back pain laterality, unspecified whether  sciatica present - Meloxicam  as prescribed. Counseled on medication adherence/adverse effects.  - Patient declined referral to Orthopedics.  - Follow-up with primary provider as scheduled. - meloxicam  (MOBIC ) 7.5 MG tablet; Take 1 tablet (7.5 mg total) by mouth daily.  Dispense: 90 tablet; Refill: 0   Patient was given clear instructions to go to Emergency Department or return to medical center if symptoms don't improve, worsen, or new problems develop.The patient verbalized understanding.  I discussed the assessment and treatment plan with the patient. The patient was provided an opportunity to ask questions and all were answered. The patient agreed with the plan and demonstrated an understanding of the instructions.   The patient was advised to call back or seek an in-person evaluation if the symptoms worsen or if the condition fails to improve as anticipated.    Greig Drones, NP 10/09/2023, 11:22 AM Primary Care at [facility]

## 2023-10-09 NOTE — Progress Notes (Signed)
 Patient been dizzy, headache and low energy,  patient has a history of cancer in his family so he wants to make sure he is ok, patient has back pain

## 2023-10-11 LAB — HEMOGLOBIN A1C
Est. average glucose Bld gHb Est-mCnc: 117 mg/dL
Hgb A1c MFr Bld: 5.7 % — ABNORMAL HIGH (ref 4.8–5.6)

## 2023-10-11 LAB — CBC
Hematocrit: 48.9 % (ref 37.5–51.0)
Hemoglobin: 15.3 g/dL (ref 13.0–17.7)
MCH: 30.4 pg (ref 26.6–33.0)
MCHC: 31.3 g/dL — ABNORMAL LOW (ref 31.5–35.7)
MCV: 97 fL (ref 79–97)
Platelets: 312 x10E3/uL (ref 150–450)
RBC: 5.03 x10E6/uL (ref 4.14–5.80)
RDW: 12.7 % (ref 11.6–15.4)
WBC: 5.8 x10E3/uL (ref 3.4–10.8)

## 2023-10-11 LAB — VITAMIN D 25 HYDROXY (VIT D DEFICIENCY, FRACTURES): Vit D, 25-Hydroxy: 19.5 ng/mL — ABNORMAL LOW (ref 30.0–100.0)

## 2023-10-11 LAB — PSA: Prostate Specific Ag, Serum: 0.9 ng/mL (ref 0.0–4.0)

## 2023-10-11 LAB — TSH: TSH: 1.92 u[IU]/mL (ref 0.450–4.500)

## 2023-10-13 ENCOUNTER — Ambulatory Visit: Payer: Self-pay | Admitting: Family

## 2023-10-13 DIAGNOSIS — E559 Vitamin D deficiency, unspecified: Secondary | ICD-10-CM

## 2023-10-13 DIAGNOSIS — Z122 Encounter for screening for malignant neoplasm of respiratory organs: Secondary | ICD-10-CM

## 2023-10-13 MED ORDER — VITAMIN D (ERGOCALCIFEROL) 1.25 MG (50000 UNIT) PO CAPS: 50000.0000 [IU] | ORAL_CAPSULE | ORAL | 0 refills | Status: AC

## 2023-10-21 ENCOUNTER — Ambulatory Visit
Admission: RE | Admit: 2023-10-21 | Discharge: 2023-10-21 | Disposition: A | Discharge status: Home / Self Care | Source: Ambulatory Visit | Attending: Family | Admitting: Family

## 2023-10-21 DIAGNOSIS — Z801 Family history of malignant neoplasm of trachea, bronchus and lung: Secondary | ICD-10-CM

## 2023-10-21 DIAGNOSIS — Z720 Tobacco use: Secondary | ICD-10-CM

## 2023-10-22 NOTE — Telephone Encounter (Signed)
 Copied from CRM #[id number]. Topic: Clinical - Request for Lab/Test Order >> Oct 21, 2023  2:25 PM Tinnie BROCKS wrote: Reason for CRM: Janella from [facility] imaging ph: [phone] ext. [phone] is calling to let us  know pt does not qualify for lung cancer screening and the order needs to be placed as a chest x-ray w/o contrast.

## 2023-10-27 ENCOUNTER — Ambulatory Visit
Admission: RE | Admit: 2023-10-27 | Discharge: 2023-10-27 | Disposition: A | Discharge status: Home / Self Care | Source: Ambulatory Visit | Attending: Family | Admitting: Family

## 2023-10-27 DIAGNOSIS — Z122 Encounter for screening for malignant neoplasm of respiratory organs: Secondary | ICD-10-CM

## 2023-11-10 ENCOUNTER — Ambulatory Visit: Payer: Self-pay | Admitting: Family

## 2023-11-20 ENCOUNTER — Ambulatory Visit

## 2023-11-26 ENCOUNTER — Ambulatory Visit
